# Patient Record
Sex: Male | Born: 1995 | Race: White | Hispanic: No | Marital: Single | Smoking: Current every day smoker
Health system: Southern US, Community
[De-identification: ages and names within clinical notes are randomized; demographics above are authoritative.]

## PROBLEM LIST (undated history)

## (undated) DIAGNOSIS — I10 Essential (primary) hypertension: Secondary | ICD-10-CM

## (undated) DIAGNOSIS — K219 Gastro-esophageal reflux disease without esophagitis: Secondary | ICD-10-CM

## (undated) DIAGNOSIS — F99 Mental disorder, not otherwise specified: Secondary | ICD-10-CM

## (undated) DIAGNOSIS — F419 Anxiety disorder, unspecified: Secondary | ICD-10-CM

## (undated) HISTORY — PX: ANKLE SURGERY: SHX546

## (undated) HISTORY — PX: FRACTURE SURGERY: SHX138

## (undated) HISTORY — DX: Anxiety disorder, unspecified: F41.9

## (undated) HISTORY — PX: NOSE SURGERY: SHX723

## (undated) HISTORY — DX: Gastro-esophageal reflux disease without esophagitis: K21.9

---
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
## 2013-07-12 DIAGNOSIS — E109 Type 1 diabetes mellitus without complications: Secondary | ICD-10-CM | POA: Insufficient documentation

## 2016-06-01 ENCOUNTER — Other Ambulatory Visit: Payer: Self-pay | Admitting: Endocrinology

## 2016-06-01 DIAGNOSIS — R634 Abnormal weight loss: Secondary | ICD-10-CM

## 2016-06-01 DIAGNOSIS — R630 Anorexia: Secondary | ICD-10-CM

## 2016-06-02 ENCOUNTER — Other Ambulatory Visit: Payer: Self-pay

## 2016-06-08 ENCOUNTER — Ambulatory Visit
Admission: RE | Admit: 2016-06-08 | Discharge: 2016-06-08 | Disposition: A | Discharge status: Home / Self Care | Payer: BLUE CROSS/BLUE SHIELD | Source: Ambulatory Visit | Attending: Endocrinology | Admitting: Endocrinology

## 2016-06-08 DIAGNOSIS — R634 Abnormal weight loss: Secondary | ICD-10-CM

## 2016-06-08 DIAGNOSIS — R630 Anorexia: Secondary | ICD-10-CM

## 2016-06-08 MED ORDER — IOPAMIDOL (ISOVUE-300) INJECTION 61%
100.0000 mL | Freq: Once | INTRAVENOUS | Status: AC | PRN
  Administered 2016-06-08: 100 mL via INTRAVENOUS

## 2016-07-02 ENCOUNTER — Inpatient Hospital Stay (HOSPITAL_COMMUNITY)
Admission: EM | Admit: 2016-07-02 | Discharge: 2016-07-03 | DRG: 638 | Discharge status: Against Medical Advice | Payer: BLUE CROSS/BLUE SHIELD | Attending: Internal Medicine | Admitting: Internal Medicine

## 2016-07-02 ENCOUNTER — Emergency Department (HOSPITAL_COMMUNITY): Payer: BLUE CROSS/BLUE SHIELD

## 2016-07-02 ENCOUNTER — Encounter (HOSPITAL_COMMUNITY): Payer: Self-pay | Admitting: Emergency Medicine

## 2016-07-02 DIAGNOSIS — F1210 Cannabis abuse, uncomplicated: Secondary | ICD-10-CM | POA: Diagnosis present

## 2016-07-02 DIAGNOSIS — E860 Dehydration: Secondary | ICD-10-CM | POA: Diagnosis present

## 2016-07-02 DIAGNOSIS — J069 Acute upper respiratory infection, unspecified: Secondary | ICD-10-CM | POA: Diagnosis present

## 2016-07-02 DIAGNOSIS — Z794 Long term (current) use of insulin: Secondary | ICD-10-CM | POA: Diagnosis not present

## 2016-07-02 DIAGNOSIS — E8729 Other acidosis: Secondary | ICD-10-CM | POA: Diagnosis present

## 2016-07-02 DIAGNOSIS — E1010 Type 1 diabetes mellitus with ketoacidosis without coma: Principal | ICD-10-CM | POA: Diagnosis present

## 2016-07-02 DIAGNOSIS — E872 Acidosis: Secondary | ICD-10-CM | POA: Diagnosis present

## 2016-07-02 DIAGNOSIS — Z720 Tobacco use: Secondary | ICD-10-CM

## 2016-07-02 DIAGNOSIS — Z87898 Personal history of other specified conditions: Secondary | ICD-10-CM

## 2016-07-02 DIAGNOSIS — D72829 Elevated white blood cell count, unspecified: Secondary | ICD-10-CM | POA: Diagnosis present

## 2016-07-02 DIAGNOSIS — I10 Essential (primary) hypertension: Secondary | ICD-10-CM | POA: Diagnosis present

## 2016-07-02 DIAGNOSIS — E875 Hyperkalemia: Secondary | ICD-10-CM | POA: Diagnosis present

## 2016-07-02 DIAGNOSIS — R112 Nausea with vomiting, unspecified: Secondary | ICD-10-CM | POA: Diagnosis not present

## 2016-07-02 DIAGNOSIS — E1110 Type 2 diabetes mellitus with ketoacidosis without coma: Secondary | ICD-10-CM | POA: Diagnosis present

## 2016-07-02 DIAGNOSIS — N179 Acute kidney failure, unspecified: Secondary | ICD-10-CM | POA: Diagnosis present

## 2016-07-02 DIAGNOSIS — Z8249 Family history of ischemic heart disease and other diseases of the circulatory system: Secondary | ICD-10-CM

## 2016-07-02 DIAGNOSIS — F1291 Cannabis use, unspecified, in remission: Secondary | ICD-10-CM

## 2016-07-02 DIAGNOSIS — F17210 Nicotine dependence, cigarettes, uncomplicated: Secondary | ICD-10-CM | POA: Diagnosis present

## 2016-07-02 HISTORY — DX: Essential (primary) hypertension: I10

## 2016-07-02 LAB — CBC WITH DIFFERENTIAL/PLATELET
BASOS ABS: 0 10*3/uL (ref 0.0–0.1)
BASOS PCT: 0 %
EOS ABS: 0 10*3/uL (ref 0.0–0.7)
Eosinophils Relative: 0 %
HCT: 45.9 % (ref 39.0–52.0)
Hemoglobin: 16.6 g/dL (ref 13.0–17.0)
LYMPHS ABS: 1.4 10*3/uL (ref 0.7–4.0)
Lymphocytes Relative: 5 %
MCH: 30.4 pg (ref 26.0–34.0)
MCHC: 36.2 g/dL — AB (ref 30.0–36.0)
MCV: 84.1 fL (ref 78.0–100.0)
MONO ABS: 1.4 10*3/uL — AB (ref 0.1–1.0)
Monocytes Relative: 5 %
Neutro Abs: 24.6 10*3/uL — ABNORMAL HIGH (ref 1.7–7.7)
Neutrophils Relative %: 90 %
Platelets: 396 10*3/uL (ref 150–400)
RBC: 5.46 MIL/uL (ref 4.22–5.81)
RDW: 12.5 % (ref 11.5–15.5)
WBC: 27.4 10*3/uL — ABNORMAL HIGH (ref 4.0–10.5)

## 2016-07-02 LAB — BLOOD GAS, VENOUS
ACID-BASE DEFICIT: 16.9 mmol/L — AB (ref 0.0–2.0)
BICARBONATE: 10.3 mmol/L — AB (ref 20.0–28.0)
Drawn by: 295031
FIO2: 21
O2 Saturation: 87.3 %
PATIENT TEMPERATURE: 98.6
PO2 VEN: 60.7 mmHg — AB (ref 32.0–45.0)
pCO2, Ven: 27.9 mmHg — ABNORMAL LOW (ref 44.0–60.0)
pH, Ven: 7.193 — CL (ref 7.250–7.430)

## 2016-07-02 LAB — URINALYSIS, ROUTINE W REFLEX MICROSCOPIC
Bacteria, UA: NONE SEEN
Bilirubin Urine: NEGATIVE
HGB URINE DIPSTICK: NEGATIVE
Ketones, ur: 80 mg/dL — AB
Leukocytes, UA: NEGATIVE
Nitrite: NEGATIVE
PROTEIN: NEGATIVE mg/dL
SPECIFIC GRAVITY, URINE: 1.023 (ref 1.005–1.030)
SQUAMOUS EPITHELIAL / LPF: NONE SEEN
WBC UA: NONE SEEN WBC/hpf (ref 0–5)
pH: 5 (ref 5.0–8.0)

## 2016-07-02 LAB — COMPREHENSIVE METABOLIC PANEL
ALBUMIN: 4.8 g/dL (ref 3.5–5.0)
ALT: 20 U/L (ref 17–63)
AST: 33 U/L (ref 15–41)
Alkaline Phosphatase: 112 U/L (ref 38–126)
Anion gap: 28 — ABNORMAL HIGH (ref 5–15)
BUN: 21 mg/dL — AB (ref 6–20)
CHLORIDE: 95 mmol/L — AB (ref 101–111)
CO2: 9 mmol/L — ABNORMAL LOW (ref 22–32)
CREATININE: 1.26 mg/dL — AB (ref 0.61–1.24)
Calcium: 9.1 mg/dL (ref 8.9–10.3)
GFR calc Af Amer: >60 mL/min (ref >60)
GFR calc non Af Amer: >60 mL/min (ref >60)
GLUCOSE: 596 mg/dL — AB (ref 65–99)
POTASSIUM: 5.8 mmol/L — AB (ref 3.5–5.1)
Sodium: 132 mmol/L — ABNORMAL LOW (ref 135–145)
Total Bilirubin: 2.4 mg/dL — ABNORMAL HIGH (ref 0.3–1.2)
Total Protein: 7.6 g/dL (ref 6.5–8.1)

## 2016-07-02 LAB — BASIC METABOLIC PANEL
ANION GAP: 14 (ref 5–15)
BUN: 15 mg/dL (ref 6–20)
CO2: 15 mmol/L — AB (ref 22–32)
Calcium: 7.9 mg/dL — ABNORMAL LOW (ref 8.9–10.3)
Chloride: 106 mmol/L (ref 101–111)
Creatinine, Ser: 0.98 mg/dL (ref 0.61–1.24)
GFR calc Af Amer: >60 mL/min (ref >60)
GLUCOSE: 202 mg/dL — AB (ref 65–99)
POTASSIUM: 4.2 mmol/L (ref 3.5–5.1)
Sodium: 135 mmol/L (ref 135–145)

## 2016-07-02 LAB — I-STAT CHEM 8, ED
BUN: 26 mg/dL — AB (ref 6–20)
CALCIUM ION: 1.08 mmol/L — AB (ref 1.15–1.40)
CREATININE: 0.8 mg/dL (ref 0.61–1.24)
Chloride: 101 mmol/L (ref 101–111)
GLUCOSE: 624 mg/dL — AB (ref 65–99)
HCT: 49 % (ref 39.0–52.0)
HEMOGLOBIN: 16.7 g/dL (ref 13.0–17.0)
Potassium: 5.8 mmol/L — ABNORMAL HIGH (ref 3.5–5.1)
Sodium: 130 mmol/L — ABNORMAL LOW (ref 135–145)
TCO2: 14 mmol/L (ref 0–100)

## 2016-07-02 LAB — CBG MONITORING, ED
GLUCOSE-CAPILLARY: 550 mg/dL — AB (ref 65–99)
GLUCOSE-CAPILLARY: 329 mg/dL — AB (ref 65–99)
Glucose-Capillary: 576 mg/dL (ref 65–99)
Glucose-Capillary: 369 mg/dL — ABNORMAL HIGH (ref 65–99)
Glucose-Capillary: 203 mg/dL — ABNORMAL HIGH (ref 65–99)

## 2016-07-02 LAB — LIPASE, BLOOD: LIPASE: 30 U/L (ref 11–51)

## 2016-07-02 MED ORDER — SODIUM CHLORIDE 0.9 % IV BOLUS (SEPSIS)
1000.0000 mL | Freq: Once | INTRAVENOUS | Status: AC
  Administered 2016-07-02: 1000 mL via INTRAVENOUS

## 2016-07-02 MED ORDER — SODIUM CHLORIDE 0.9 % IV SOLN
INTRAVENOUS | Status: DC
  Administered 2016-07-02: 2.4 [IU]/h via INTRAVENOUS
  Filled 2016-07-02: qty 2.5

## 2016-07-02 MED ORDER — DEXTROSE-NACL 5-0.45 % IV SOLN
INTRAVENOUS | Status: DC
  Administered 2016-07-02 – 2016-07-03 (×3): via INTRAVENOUS

## 2016-07-02 MED ORDER — SODIUM CHLORIDE 0.9 % IV SOLN
INTRAVENOUS | Status: DC
  Administered 2016-07-02: 5.2 [IU]/h via INTRAVENOUS
  Filled 2016-07-02: qty 2.5

## 2016-07-02 MED ORDER — LISINOPRIL 10 MG PO TABS
10.0000 mg | ORAL_TABLET | Freq: Every day | ORAL | Status: DC
  Administered 2016-07-02: 10 mg via ORAL
  Filled 2016-07-02: qty 1

## 2016-07-02 MED ORDER — ENOXAPARIN SODIUM 40 MG/0.4ML ~~LOC~~ SOLN: 40.0000 mg | SUBCUTANEOUS | Status: DC

## 2016-07-02 MED ORDER — INSULIN REGULAR BOLUS VIA INFUSION
0.0000 [IU] | Freq: Three times a day (TID) | INTRAVENOUS | Status: DC
  Filled 2016-07-02: qty 10

## 2016-07-02 MED ORDER — DEXTROSE-NACL 5-0.45 % IV SOLN
INTRAVENOUS | Status: DC
  Administered 2016-07-02: 20:00:00 via INTRAVENOUS

## 2016-07-02 MED ORDER — SODIUM CHLORIDE 0.9 % IV SOLN
INTRAVENOUS | Status: AC
  Administered 2016-07-02: 23:00:00 via INTRAVENOUS

## 2016-07-02 MED ORDER — DEXTROSE 50 % IV SOLN: 25.0000 mL | INTRAVENOUS | Status: DC | PRN

## 2016-07-02 MED ORDER — DM-GUAIFENESIN ER 30-600 MG PO TB12: 1.0000 | ORAL_TABLET | Freq: Two times a day (BID) | ORAL | Status: DC | PRN

## 2016-07-02 MED ORDER — FAMOTIDINE 20 MG PO TABS
20.0000 mg | ORAL_TABLET | Freq: Every day | ORAL | Status: DC
Start: 2016-07-02 — End: 2016-07-03
  Administered 2016-07-02 – 2016-07-03 (×2): 20 mg via ORAL
  Filled 2016-07-02 (×2): qty 1

## 2016-07-02 MED ORDER — SODIUM CHLORIDE 0.9 % IV SOLN: INTRAVENOUS | Status: DC

## 2016-07-02 MED ORDER — SODIUM CHLORIDE 0.9 % IV SOLN
INTRAVENOUS | Status: DC
  Administered 2016-07-02: 19:00:00 via INTRAVENOUS

## 2016-07-02 MED ORDER — ONDANSETRON HCL 4 MG/2ML IJ SOLN
4.0000 mg | Freq: Once | INTRAMUSCULAR | Status: AC
  Administered 2016-07-02: 4 mg via INTRAVENOUS
  Filled 2016-07-02: qty 2

## 2016-07-02 NOTE — ED Notes (Signed)
Informed MD of Chem 8 results.

## 2016-07-02 NOTE — ED Provider Notes (Signed)
[facility]-EMERGENCY DEPT Provider Note   CSN: [id number] Arrival date & time: 07/02/16  1503     History   Chief Complaint Chief Complaint  Patient presents with  . Hyperglycemia  . Nausea  . Emesis  . Generalized Body Aches    HPI [name] is a 21 y.o. male who presents with  Hyperglycemia,  N/V. The patient has a pmh of DM type 1 siwith hx of DKA. He is a daily smoker of both tobacco and marijuana. Patient states that several months ago. He was switched to a new type of insulin and since that time, his sugars have been running about 500 and his hemoglobin A1c has been running about 10. Patient states that he's also had about a 20 pound weight loss without trying. He has recently had about 2 weeks of URI symptoms with cough productive of phlegm and body aches along with nasal congestion. He did see his PCP and had a flu swab that was negative. This morning the patient awoke around 9 AM and became nauseous. He started vomiting and has had constant vomiting about every 15 minutes since that time. When EMS arrived, he was found to have blood sugars in the 400s. The patient complains of nausea, dry mouth, and body aches. Patient's last dose of insulin. The60 units last night.   HPI  Past Medical History:  Diagnosis Date  . Diabetes mellitus   . Mental disorder     Patient Active Problem List   Diagnosis Date Noted  . DKA (diabetic ketoacidoses) (HCC) 07/02/2016  . Tachycardia 11/14/2011  . Volume depletion 11/14/2011  . 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 DKA, type 1 (HCC) 11/13/2011  . Psychosis 11/13/2011  . High anion gap metabolic acidosis 11/13/2011  . Leucocytosis 11/13/2011    Past Surgical History:  Procedure Laterality Date  . ANKLE SURGERY    . NOSE SURGERY         Home Medications    Prior to Admission medications   Medication Sig Start Date End Date Taking? Authorizing Provider  ibuprofen (ADVIL,MOTRIN) 200 MG  tablet Take 200 mg by mouth every 6 (six) hours as needed.   Yes Historical Provider, MD  insulin aspart (NOVOLOG) 100 UNIT/ML injection Inject 10 Units into the skin daily with lunch.    Yes Historical Provider, MD  lisinopril (PRINIVIL,ZESTRIL) 10 MG tablet Take 10 mg by mouth at bedtime.   Yes Historical Provider, MD  TRESIBA FLEXTOUCH 200 UNIT/ML SOPN Inject 36 Units into the skin daily. 06/15/16  Yes Historical Provider, MD    Family History History reviewed. No pertinent family history.  Social History Social History  Substance Use Topics  . Smoking status: Current Some Day Smoker    Packs/day: 0.50  . Smokeless tobacco: Not on file  . Alcohol use No     Allergies   Patient has no known allergies.   Review of Systems Review of Systems  Ten systems reviewed and are negative for acute change, except as noted in the HPI.   Physical Exam Updated Vital Signs BP (!) 114/48 (BP Location: Right Arm)   Pulse 114   Temp 98.1 F (36.7 C) (Oral)   Resp 16   Ht 5\' 6"  (1.676 m)   Wt 56.7 kg   SpO2 99%   BMI 20.18 kg/m   Physical Exam  Constitutional: He appears well-developed and well-nourished. No distress.  Thin male in no acute distress  HENT:  Head: Normocephalic and atraumatic.  Dry oral mucosa   Eyes: Conjunctivae and EOM are normal. Pupils are equal, round, and reactive to light. No scleral icterus.  Neck: Normal range of motion. Neck supple.  Cardiovascular: Normal rate, regular rhythm and normal heart sounds.   Pulmonary/Chest: Effort normal. No respiratory distress. He has rhonchi in the right lower field and the left lower field.  Abdominal: Soft. There is no tenderness.  Musculoskeletal: He exhibits no edema.  Neurological: He is alert.  Skin: Skin is warm and dry. He is not diaphoretic.  Psychiatric: His behavior is normal.  Nursing note and vitals reviewed.    ED Treatments / Results  Labs (all labs ordered are listed, but only abnormal results are  displayed) Labs Reviewed  CBC WITH DIFFERENTIAL/PLATELET - Abnormal; Notable for the following:       Result Value   WBC 27.4 (*)    MCHC 36.2 (*)    Neutro Abs 24.6 (*)    Monocytes Absolute 1.4 (*)    All other components within normal limits  BLOOD GAS, VENOUS - Abnormal; Notable for the following:    pH, Ven 7.193 (*)    pCO2, Ven 27.9 (*)    pO2, Ven 60.7 (*)    Bicarbonate 10.3 (*)    Acid-base deficit 16.9 (*)    All other components within normal limits  URINALYSIS, ROUTINE W REFLEX MICROSCOPIC - Abnormal; Notable for the following:    Color, Urine STRAW (*)    Glucose, UA >=500 (*)    Ketones, ur 80 (*)    All other components within normal limits  COMPREHENSIVE METABOLIC PANEL - Abnormal; Notable for the following:    Sodium 132 (*)    Potassium 5.8 (*)    Chloride 95 (*)    CO2 9 (*)    Glucose, Bld 596 (*)    BUN 21 (*)    Creatinine, Ser 1.26 (*)    Total Bilirubin 2.4 (*)    Anion gap 28 (*)    All other components within normal limits  CBG MONITORING, ED - Abnormal; Notable for the following:    Glucose-Capillary 550 (*)    All other components within normal limits  I-STAT CHEM 8, ED - Abnormal; Notable for the following:    Sodium 130 (*)    Potassium 5.8 (*)    BUN 26 (*)    Glucose, Bld 624 (*)    Calcium, Ion 1.08 (*)    All other components within normal limits  CBG MONITORING, ED - Abnormal; Notable for the following:    Glucose-Capillary 576 (*)    All other components within normal limits  CBG MONITORING, ED - Abnormal; Notable for the following:    Glucose-Capillary 369 (*)    All other components within normal limits  RESPIRATORY PANEL BY PCR  LIPASE, BLOOD  RAPID URINE DRUG SCREEN, HOSP PERFORMED    EKG  EKG Interpretation None       Radiology Dg Chest Portable 1 View  Result Date: 07/02/2016 CLINICAL DATA:  Pt c/o weakness, sob, chest discomfort, n/v, onset x "awhile," worsened today. pt states his diabetes medicine changed  recently, notes fluctuating blood sugar levels. EXAM: PORTABLE CHEST 1 VIEW COMPARISON:  11/13/2011 FINDINGS: The heart size and mediastinal contours are within normal limits. Both lungs are clear. No pleural effusion or pneumothorax. The visualized skeletal structures are unremarkable. IMPRESSION: Normal frontal chest radiograph. Electronically Signed   By: Amie Portland M.D.   On: 07/02/2016  15:55    Procedures Procedures (including critical care time)  Medications Ordered in ED Medications  insulin regular (NOVOLIN R,HUMULIN R) 250 Units in sodium chloride 0.9 % 250 mL (1 Units/mL) infusion (5.2 Units/hr Intravenous New Bag/Given 07/02/16 1624)  sodium chloride 0.9 % bolus 1,000 mL (1,000 mLs Intravenous New Bag/Given 07/02/16 1624)    And  sodium chloride 0.9 % bolus 1,000 mL (not administered)    And  0.9 %  sodium chloride infusion (not administered)  ondansetron (ZOFRAN) injection 4 mg (4 mg Intravenous Given 07/02/16 1558)     Initial Impression / Assessment and Plan / ED Course  I have reviewed the triage vital signs and the nursing notes.  Pertinent labs & imaging results that were available during my care of the patient were reviewed by me and considered in my medical decision making (see chart for details).  Clinical Course as of Jul 02 1724  Wynelle LinkSun Jul 02, 2016  1615 Patient ABG showss PH of 7.193 with  [AH]  1618 Cxr clear DG Chest Portable 1 View [AH]  1619 Anion gap is 22   [AH]    Clinical Course User Index [AH] Arthor CaptainAbigail Ronesha Heenan, PA-C    Patient with DKA Will be admitted by Dr. Catha GosselinMikhail. Stable throughout visit.  Final Clinical Impressions(s) / ED Diagnoses   Final diagnoses:  Diabetic ketoacidosis without coma associated with type 1 diabetes mellitus [facility](HCC)    New Prescriptions New Prescriptions   No medications on file     Arthor Captainbigail Janith Nielson, PA-C 07/02/16 1727    Lavera Guiseana Duo Liu, MD 07/02/16 1747

## 2016-07-02 NOTE — ED Notes (Signed)
ED Provider at bedside. 

## 2016-07-02 NOTE — H&P (Signed)
[facility] History and Physical  Robert FamKody R Elizardo EXB:[id number] DOB: 05/02/1996 DOA: 07/02/2016  PCP: Julian HySOUTH,STEPHEN ALAN, MD  Patient coming from: Home  Chief Complaint: Elevated sugars  HPI: Robert Villa is a 21 y.o. male with a medical history of diabetes mellitus, type I, hypertension, who presents to the ER with elevated sugars, nausea, vomiting.  Patient states that he has been having upper respiratory like symptoms for approximate 2 weeks. He's been experiencing a productive cough along with body aches and congestion. Patient did see his primary care physician approximately one week ago and had a negative flu swab at that time. Patient states that he does smoke marijuana regularly, last use was yesterday evening. He did have nausea and vomiting which awoke him from sleep approximate 9 AM this morning. He has had constant vomiting approximately every 15 minutes before arrival to the emergency department. Also states that he has had weight loss over the last several weeks. He's also been having more frequent urination and polydipsia. He states he's had diabetes since he was 21 years old and has been relatively stable until last 6 months. He was recently switched to [place] but does not feel that his sugars have been adequately controlled. Patient does state that he used short acting insulin yesterday evening however awoke to very elevated blood sugars in the 400s today. Patient denies any recent sick contacts, travel, denies current chest pain, dizziness, headache, diarrhea, constipation.  ED Course: Found to have DKA, sugars in 500s, pH 7.96, elevated anion gap . Started on IVF and insulin drip. [facility] called for admission.  Review of Systems:  All other systems reviewed and are negative.   Past Medical History:  Diagnosis Date  . Diabetes mellitus   . Mental disorder     Past Surgical History:  Procedure Laterality Date  . ANKLE SURGERY    . NOSE SURGERY      Social History:   reports that he has been smoking.  He has been smoking about 0.50 packs per day. He does not have any smokeless tobacco history on file. He reports that he uses drugs, including Marijuana. He reports that he does not drink alcohol.  No Known Allergies  Family history Grandfather had hypertension.   Prior to Admission medications   Medication Sig Start Date End Date Taking? Authorizing Provider  insulin aspart (NOVOLOG) 100 UNIT/ML injection Inject 10 Units into the skin daily with lunch.     Historical Provider, MD  insulin NPH (HUMULIN N,NOVOLIN N) 100 UNIT/ML injection Inject 35-50 Units into the skin daily. SS   300=35 units    400=50 units    Historical Provider, MD  TRESIBA FLEXTOUCH 200 UNIT/ML SOPN Inject 36 Units into the skin daily. 06/15/16   Historical Provider, MD    Physical Exam: Vitals:   07/02/16 1556 07/02/16 1632  BP: 130/86 (!) 114/48  Pulse: 107 114  Resp: 16 16  Temp:       General: Well developed, well nourished, NAD, appears stated age  HEENT: NCAT, PERRLA, EOMI, Anicteic Sclera, mucous membranes dry.   Neck: Supple, no JVD, no masses  Cardiovascular: S1 S2 auscultated, no rubs, murmurs or gallops. Tachycardia.  Respiratory: Mild wheezing notes anteriorly, otherwise clear.   Abdomen: Soft, nontender, nondistended, + bowel sounds  Extremities: warm dry without cyanosis clubbing or edema  Neuro: AAOx3, cranial nerves grossly intact. Strength 5/5 in patient's upper and lower extremities bilaterally  Skin: Without rashes exudates or nodules  Psych: Normal affect and  demeanor with intact judgement and insight  Labs on Admission: I have personally reviewed following labs and imaging studies CBC:  Recent Labs Lab 07/02/16 1553 07/02/16 1613  WBC 27.4*  --   NEUTROABS PENDING  --   HGB 16.6 16.7  HCT 45.9 49.0  MCV 84.1  --   PLT 396  --    Basic Metabolic Panel:  Recent Labs Lab 07/02/16 1613  NA 130*  K 5.8*  CL 101  GLUCOSE 624*    BUN 26*  CREATININE 0.80   GFR: Estimated Creatinine Clearance: 116.2 mL/min (by C-G formula based on SCr of 0.8 mg/dL). Liver Function Tests: No results for input(s): AST, ALT, ALKPHOS, BILITOT, PROT, ALBUMIN in the last 168 hours. No results for input(s): LIPASE, AMYLASE in the last 168 hours. No results for input(s): AMMONIA in the last 168 hours. Coagulation Profile: No results for input(s): INR, PROTIME in the last 168 hours. Cardiac Enzymes: No results for input(s): CKTOTAL, CKMB, CKMBINDEX, TROPONINI in the last 168 hours. BNP (last 3 results) No results for input(s): PROBNP in the last 8760 hours. HbA1C: No results for input(s): HGBA1C in the last 72 hours. CBG:  Recent Labs Lab 07/02/16 1513 07/02/16 1620  GLUCAP 550* 576*   Lipid Profile: No results for input(s): CHOL, HDL, LDLCALC, TRIG, CHOLHDL, LDLDIRECT in the last 72 hours. Thyroid Function Tests: No results for input(s): TSH, T4TOTAL, FREET4, T3FREE, THYROIDAB in the last 72 hours. Anemia Panel: No results for input(s): VITAMINB12, FOLATE, FERRITIN, TIBC, IRON, RETICCTPCT in the last 72 hours. Urine analysis:    Component Value Date/Time   COLORURINE STRAW (A) 07/02/2016 1530   APPEARANCEUR CLEAR 07/02/2016 1530   LABSPEC 1.023 07/02/2016 1530   PHURINE 5.0 07/02/2016 1530   GLUCOSEU >=500 (A) 07/02/2016 1530   HGBUR NEGATIVE 07/02/2016 1530   BILIRUBINUR NEGATIVE 07/02/2016 1530   KETONESUR 80 (A) 07/02/2016 1530   PROTEINUR NEGATIVE 07/02/2016 1530   UROBILINOGEN 0.2 11/13/2011 1303   NITRITE NEGATIVE 07/02/2016 1530   LEUKOCYTESUR NEGATIVE 07/02/2016 1530   Sepsis Labs: @LABRCNTIP (procalcitonin:4,lacticidven:4) )No results found for this or any previous visit (from the past 240 hour(s)).   Radiological Exams on Admission: Dg Chest Portable 1 View  Result Date: 07/02/2016 CLINICAL DATA:  Pt c/o weakness, sob, chest discomfort, n/v, onset x "awhile," worsened today. pt states his diabetes  medicine changed recently, notes fluctuating blood sugar levels. EXAM: PORTABLE CHEST 1 VIEW COMPARISON:  11/13/2011 FINDINGS: The heart size and mediastinal contours are within normal limits. Both lungs are clear. No pleural effusion or pneumothorax. The visualized skeletal structures are unremarkable. IMPRESSION: Normal frontal chest radiograph. Electronically Signed   By: Amie Portland M.D.   On: 07/02/2016 15:55    EKG: None  Assessment/Plan  DKA/Uncontrolled diabetes mellitus type I -Blood glucose 624 on admission, anion gap 28 -pH 7.193, bicarb 9 -Utilized DKA order set, monitor BMP q4hrs and CBG monitoring  -Admit to stepdown, placed on glucose stabilizer -Continue IV fluids -Nothing by mouth -UA and chest x-ray unremarkable for infection -Of note, patient states is his hemoglobin A1c has been very elevated lately around 10 since starting Tresiba. He does follow with Dr. Evlyn Kanner  Leukocytosis -UA/CXR unremarkable for infection -WBC 27.4 -Likely reactive -Continue to monitor CBC  Possible upper respiratory infection/Cough -have asked for respiratory viral panel  -mucinex for cough  Pseudohyponatremia -Secondary to DKA -Continue IVF, monitor BMP   Hyperkalemia -Suspect potassium will improve once insulin drip has started. Will check BMP every 4 hours  Nausea/Emesis -Continue antiemetics PRN  Acute kidney injury -Creatinine 1.26 upon admission -Likely secondary to DKA and dehydration -Suspect will improve with IV fluids -Continue to monitor BMP  Essential hypertension -Continue lisinopril  Marijuana abuse -Drug screen pending -Last use yesterday -Counseled on drug use cessation  Tobacco abuse -Smokes 3-4 cigarettes per day -Counseled on smoking cessation -Declined nicotine patch  DVT prophylaxis: Lovenox  Code Status: Full  Family Communication: Family at bedside. Admission, patients condition and plan of care including tests being ordered have been  discussed with the patient and family who indicate understanding and agree with the plan and Code Status.  Disposition Plan: Admitted to stepdown. Home when stable/DKA resolves   Consults called: None   Admission status: Inpatient   Time spent: 65 minutes  Hao Dion D.O. [facility] Pager [phone]  If 7PM-7AM, please contact night-coverage [email or web address] Password [facility] 07/02/2016, 4:46 PM

## 2016-07-02 NOTE — ED Triage Notes (Addendum)
Per EMS, patient from home, c/o hyperglycemia, N/V, and body aches since this morning. Patient also reports he has not had his insulin since last night. Reports he took 60 units of insulin at that time. Ambulatory with EMS.

## 2016-07-02 NOTE — ED Notes (Signed)
Bed: EA54WA22 Expected date:  Expected time:  Means of arrival:  Comments: 21 yo N/V-non-compliant DM1-cbg 443

## 2016-07-03 ENCOUNTER — Encounter (HOSPITAL_COMMUNITY): Payer: Self-pay

## 2016-07-03 LAB — GLUCOSE, CAPILLARY
GLUCOSE-CAPILLARY: 132 mg/dL — AB (ref 65–99)
GLUCOSE-CAPILLARY: 131 mg/dL — AB (ref 65–99)
GLUCOSE-CAPILLARY: 121 mg/dL — AB (ref 65–99)
GLUCOSE-CAPILLARY: 127 mg/dL — AB (ref 65–99)
GLUCOSE-CAPILLARY: 132 mg/dL — AB (ref 65–99)
GLUCOSE-CAPILLARY: 109 mg/dL — AB (ref 65–99)
GLUCOSE-CAPILLARY: 117 mg/dL — AB (ref 65–99)
GLUCOSE-CAPILLARY: 123 mg/dL — AB (ref 65–99)
Glucose-Capillary: 110 mg/dL — ABNORMAL HIGH (ref 65–99)
Glucose-Capillary: 116 mg/dL — ABNORMAL HIGH (ref 65–99)
Glucose-Capillary: 141 mg/dL — ABNORMAL HIGH (ref 65–99)
Glucose-Capillary: 149 mg/dL — ABNORMAL HIGH (ref 65–99)
Glucose-Capillary: 157 mg/dL — ABNORMAL HIGH (ref 65–99)
Glucose-Capillary: 161 mg/dL — ABNORMAL HIGH (ref 65–99)
Glucose-Capillary: 163 mg/dL — ABNORMAL HIGH (ref 65–99)
Glucose-Capillary: 179 mg/dL — ABNORMAL HIGH (ref 65–99)
Glucose-Capillary: 179 mg/dL — ABNORMAL HIGH (ref 65–99)
Glucose-Capillary: 180 mg/dL — ABNORMAL HIGH (ref 65–99)
Glucose-Capillary: 209 mg/dL — ABNORMAL HIGH (ref 65–99)

## 2016-07-03 LAB — RESPIRATORY PANEL BY PCR
ADENOVIRUS-RVPPCR: NOT DETECTED
BORDETELLA PERTUSSIS-RVPCR: NOT DETECTED
CHLAMYDOPHILA PNEUMONIAE-RVPPCR: NOT DETECTED
Coronavirus 229E: NOT DETECTED
Coronavirus HKU1: NOT DETECTED
Coronavirus NL63: NOT DETECTED
Coronavirus OC43: NOT DETECTED
INFLUENZA A-RVPPCR: NOT DETECTED
INFLUENZA B-RVPPCR: NOT DETECTED
Metapneumovirus: NOT DETECTED
Mycoplasma pneumoniae: NOT DETECTED
PARAINFLUENZA VIRUS 4-RVPPCR: NOT DETECTED
Parainfluenza Virus 1: NOT DETECTED
Parainfluenza Virus 2: NOT DETECTED
Parainfluenza Virus 3: NOT DETECTED
RESPIRATORY SYNCYTIAL VIRUS-RVPPCR: NOT DETECTED
RHINOVIRUS / ENTEROVIRUS - RVPPCR: NOT DETECTED

## 2016-07-03 LAB — RAPID URINE DRUG SCREEN, HOSP PERFORMED
AMPHETAMINES: NOT DETECTED
BENZODIAZEPINES: NOT DETECTED
Barbiturates: NOT DETECTED
COCAINE: NOT DETECTED
OPIATES: NOT DETECTED
Tetrahydrocannabinol: POSITIVE — AB

## 2016-07-03 LAB — BASIC METABOLIC PANEL
ANION GAP: 15 (ref 5–15)
ANION GAP: 13 (ref 5–15)
Anion gap: 11 (ref 5–15)
Anion gap: 13 (ref 5–15)
BUN: 11 mg/dL (ref 6–20)
BUN: 7 mg/dL (ref 6–20)
BUN: 5 mg/dL — AB (ref 6–20)
CHLORIDE: 107 mmol/L (ref 101–111)
CHLORIDE: 106 mmol/L (ref 101–111)
CHLORIDE: 106 mmol/L (ref 101–111)
CO2: 13 mmol/L — ABNORMAL LOW (ref 22–32)
CO2: 17 mmol/L — AB (ref 22–32)
CO2: 17 mmol/L — AB (ref 22–32)
CO2: 13 mmol/L — AB (ref 22–32)
CREATININE: 0.67 mg/dL (ref 0.61–1.24)
CREATININE: 0.64 mg/dL (ref 0.61–1.24)
Calcium: 7.7 mg/dL — ABNORMAL LOW (ref 8.9–10.3)
Calcium: 8.2 mg/dL — ABNORMAL LOW (ref 8.9–10.3)
Calcium: 8.1 mg/dL — ABNORMAL LOW (ref 8.9–10.3)
Calcium: 8 mg/dL — ABNORMAL LOW (ref 8.9–10.3)
Chloride: 109 mmol/L (ref 101–111)
Creatinine, Ser: 0.82 mg/dL (ref 0.61–1.24)
Creatinine, Ser: 0.74 mg/dL (ref 0.61–1.24)
GFR calc Af Amer: >60 mL/min (ref >60)
GFR calc Af Amer: >60 mL/min (ref >60)
GFR calc Af Amer: >60 mL/min (ref >60)
GFR calc non Af Amer: >60 mL/min (ref >60)
GFR calc non Af Amer: >60 mL/min (ref >60)
GLUCOSE: 159 mg/dL — AB (ref 65–99)
Glucose, Bld: 151 mg/dL — ABNORMAL HIGH (ref 65–99)
Glucose, Bld: 169 mg/dL — ABNORMAL HIGH (ref 65–99)
Glucose, Bld: 122 mg/dL — ABNORMAL HIGH (ref 65–99)
POTASSIUM: 4 mmol/L (ref 3.5–5.1)
Potassium: 3.8 mmol/L (ref 3.5–5.1)
Potassium: 3.6 mmol/L (ref 3.5–5.1)
Potassium: 3.1 mmol/L — ABNORMAL LOW (ref 3.5–5.1)
SODIUM: 135 mmol/L (ref 135–145)
Sodium: 135 mmol/L (ref 135–145)
Sodium: 134 mmol/L — ABNORMAL LOW (ref 135–145)
Sodium: 136 mmol/L (ref 135–145)

## 2016-07-03 LAB — CBC
HCT: 38.3 % — ABNORMAL LOW (ref 39.0–52.0)
Hemoglobin: 13.3 g/dL (ref 13.0–17.0)
MCH: 29.4 pg (ref 26.0–34.0)
MCHC: 34.7 g/dL (ref 30.0–36.0)
MCV: 84.7 fL (ref 78.0–100.0)
PLATELETS: 282 10*3/uL (ref 150–400)
RBC: 4.52 MIL/uL (ref 4.22–5.81)
RDW: 12.8 % (ref 11.5–15.5)
WBC: 21.6 10*3/uL — ABNORMAL HIGH (ref 4.0–10.5)

## 2016-07-03 LAB — MRSA PCR SCREENING: MRSA by PCR: NEGATIVE

## 2016-07-03 MED ORDER — SODIUM CHLORIDE 0.9 % IV SOLN: INTRAVENOUS | Status: DC

## 2016-07-03 MED ORDER — INSULIN ASPART 100 UNIT/ML ~~LOC~~ SOLN
1.0000 [IU] | Freq: Once | SUBCUTANEOUS | Status: AC
  Administered 2016-07-03: 1 [IU] via SUBCUTANEOUS

## 2016-07-03 MED ORDER — INSULIN ASPART 100 UNIT/ML ~~LOC~~ SOLN: 0.0000 [IU] | Freq: Every day | SUBCUTANEOUS | Status: DC

## 2016-07-03 MED ORDER — POTASSIUM CHLORIDE CRYS ER 20 MEQ PO TBCR
40.0000 meq | EXTENDED_RELEASE_TABLET | Freq: Once | ORAL | Status: AC
Start: 2016-07-03 — End: 2016-07-03
  Administered 2016-07-03: 40 meq via ORAL
  Filled 2016-07-03: qty 2

## 2016-07-03 MED ORDER — DEXTROSE-NACL 5-0.45 % IV SOLN: INTRAVENOUS | Status: DC

## 2016-07-03 MED ORDER — NICOTINE 7 MG/24HR TD PT24
7.0000 mg | MEDICATED_PATCH | Freq: Every day | TRANSDERMAL | Status: DC
  Administered 2016-07-03: 7 mg via TRANSDERMAL
  Filled 2016-07-03: qty 1

## 2016-07-03 MED ORDER — INSULIN GLARGINE 100 UNIT/ML ~~LOC~~ SOLN
16.0000 [IU] | Freq: Every day | SUBCUTANEOUS | Status: DC
  Administered 2016-07-03: 16 [IU] via SUBCUTANEOUS
  Filled 2016-07-03 (×2): qty 0.16

## 2016-07-03 MED ORDER — SODIUM BICARBONATE 8.4 % IV SOLN
INTRAVENOUS | Status: DC
  Filled 2016-07-03: qty 1000

## 2016-07-03 MED ORDER — ACETAMINOPHEN 325 MG PO TABS
650.0000 mg | ORAL_TABLET | Freq: Four times a day (QID) | ORAL | Status: DC | PRN
  Administered 2016-07-03: 650 mg via ORAL
  Filled 2016-07-03: qty 2

## 2016-07-03 MED ORDER — INSULIN ASPART 100 UNIT/ML ~~LOC~~ SOLN: 0.0000 [IU] | Freq: Three times a day (TID) | SUBCUTANEOUS | Status: DC

## 2016-07-03 NOTE — Progress Notes (Signed)
Inpatient Diabetes Program Recommendations  AACE/ADA: New Consensus Statement on Inpatient Glycemic Control (2015)  Target Ranges:  Prepandial:   less than 140 mg/dL      Peak postprandial:   less than 180 mg/dL (1-2 hours)      Critically ill patients:  140 - 180 mg/dL   Results for Robert Villa, Robert Villa (MRN [id number]) as of 07/03/2016 09:56  Ref. Range 07/02/2016 15:53  Sodium Latest Ref Range: 135 - 145 mmol/L 132 (L)  Potassium Latest Ref Range: 3.5 - 5.1 mmol/L 5.8 (H)  Chloride Latest Ref Range: 101 - 111 mmol/L 95 (L)  CO2 Latest Ref Range: 22 - 32 mmol/L 9 (L)  Glucose Latest Ref Range: 65 - 99 mg/dL 409596 (HH)  BUN Latest Ref Range: 6 - 20 mg/dL 21 (H)  Creatinine Latest Ref Range: 0.61 - 1.24 mg/dL 8.111.26 (H)  Calcium Latest Ref Range: 8.9 - 10.3 mg/dL 9.1  Anion gap Latest Ref Range: 5 - 15  28 (H)    Admit with: DKA  History: Type 1 DM  Home DM Meds: Tresiba 36 units daily       Novolog  Current Insulin Orders: IV Insulin drip     -Patient sees Dr. Evlyn [place]annerSouth for DM management.  -Given 16 units Lantus at 3am, however, patient not ready to transition off IV Insulin drip yet.  Anion Gap still elevated to 15 and CO2 level only up to 13 this morning on BMET.  -IV Insulin drip restarted at 8am per MD orders.  -Will attempt to see pt today to try to determine cause of DKA and review sick day rules with patient.     --Will follow patient during hospitalization--  Ambrose FinlandJeannine Johnston Marylin Lathon RN, MSN, CDE Diabetes Coordinator Inpatient Glycemic Control Team Team Pager: [phone] (8a-5p)

## 2016-07-03 NOTE — Progress Notes (Signed)
Per previous RN drip has been stopped since 0525 this morning. Per [facility] restart patient on insulin drip, make patient NPO, and leave patient on current fluid regimen. Will continue to follow up. Insulin drip has been restarted.

## 2016-07-03 NOTE — Progress Notes (Signed)
The patient is requesting to leave immediately and against medical advice. He is aware that per his MD he needs to remain in the hospital for further treatment for DKA however he states he is ready to leave now. MD has been made aware and the patient is willing to sign AMA  Paperwork. Will continue to follow up. Currently the patient is alert and oriented and his vital signs are stable.

## 2016-07-03 NOTE — Progress Notes (Signed)
PROGRESS NOTE    Robert Villa  RUE:[id number] DOB: [birth year] DOA: 07/02/2016 PCP: Julian Hy, MD   Chief Complaint  Patient presents with  . Hyperglycemia  . Nausea  . Emesis  . Generalized Body Aches    Brief Narrative:  HPI on 07/05/2016  Robert Villa is a 21 y.o. male with a medical history of diabetes mellitus, type I, hypertension, who presents to the ER with elevated sugars, nausea, vomiting.  Patient states that he has been having upper respiratory like symptoms for approximate 2 weeks. He's been experiencing a productive cough along with body aches and congestion. Patient did see his primary care physician approximately one week ago and had a negative flu swab at that time. Patient states that he does smoke marijuana regularly, last use was yesterday evening. He did have nausea and vomiting which awoke him from sleep approximate 9 AM this morning. He has had constant vomiting approximately every 15 minutes before arrival to the emergency department. Also states that he has had weight loss over the last several weeks. He's also been having more frequent urination and polydipsia. He states he's had diabetes since he was 21 years old and has been relatively stable until last 6 months. He was recently switched to [place] but does not feel that his sugars have been adequately controlled. Patient does state that he used short acting insulin yesterday evening however awoke to very elevated blood sugars in the 400s today. Patient denies any recent sick contacts, travel, denies current chest pain, dizziness, headache, diarrhea, constipation. Assessment & Plan   DKA/Uncontrolled diabetes mellitus type I -Upon admission Blood glucose 624,  anion gap 28, pH 7.193, bicarb 9 -Utilized DKA order set, monitor BMP q4hrs and CBG monitoring  -Continue glucose stabilizer -Continue IV fluids -Nothing by mouth (not sure why he was eating breakfast this morning.  -UA and chest x-ray unremarkable for  infection -Of note, patient states is his hemoglobin A1c has been very elevated lately around 10 since starting Tresiba. He does follow with Dr. Evlyn Kanner -Not sure why insulin drip was discontinued overnight, anion gap closed, however bicarb still 13  Leukocytosis -UA/CXR unremarkable for infection -WBC 27.4 -Likely reactive -Continue to monitor CBC  Possible upper respiratory infection/Cough -Respiratory viral panel pending -mucinex for cough  Pseudohyponatremia -Secondary to DKA, Na improving -Continue IVF, monitor BMP   Hyperkalemia -Resolved.   Nausea/Emesis -Continue antiemetics PRN  Acute kidney injury -Resolved -Creatinine 1.26 upon admission. Creatinine currently 0.74 -Likely secondary to DKA and dehydration -Suspect will improve with IV fluids -Continue to monitor BMP  Essential hypertension -Continue lisinopril  Marijuana abuse -Drug screen pending -Last use yesterday -Counseled on drug use cessation  Tobacco abuse -Smokes 3-4 cigarettes per day -Counseled on smoking cessation -Now wants nicotine patch  DVT Prophylaxis  Lovenox  Code Status: Full  Family Communication: Fiance at bedside  Disposition Plan: Admitted to stepdown. Home when DKA resolves  Consultants None  Procedures  none  Antibiotics   Anti-infectives    None      Subjective:   Robert Villa seen and examined today.  Feeling mildly better. States he could no eat breakfast, felt nauseous.  Patient denies dizziness, chest pain, shortness of breath, abdominal pain, N/V/D/C.  Wants to go home today.  Objective:   Vitals:   07/03/16 0700 07/03/16 0800 07/03/16 0900 07/03/16 1000  BP: (!) 120/56 119/83 113/72 119/67  Pulse:      Resp: 18 13 18  (!) 23  Temp:  98.2  F (36.8 C)    TempSrc:  Oral    SpO2: 98% 98% 97% 98%  Weight:      Height:        Intake/Output Summary (Last 24 hours) at 07/03/16 1044 Last data filed at 07/03/16 0800  Gross per 24 hour  Intake           3857.56 ml  Output              730 ml  Net          3127.56 ml   Filed Weights   07/02/16 1514 07/02/16 2100  Weight: 56.7 kg (125 lb) 56.7 kg (125 lb)    Exam  General: Well developed, well nourished, NAD, appears stated age  HEENT: NCAT,mucous membranes moist.   Cardiovascular: S1 S2 auscultated, no rubs, murmurs or gallops. Tachycardic  Respiratory: Clear to auscultation bilaterally with equal chest rise  Abdomen: Soft, nontender, nondistended, + bowel sounds  Extremities: warm dry without cyanosis clubbing or edema  Neuro: AAOx3, nonfocal  Psych: Appropriate mood and affect   Data Reviewed: I have personally reviewed following labs and imaging studies  CBC:  Recent Labs Lab 07/02/16 1553 07/02/16 1613  WBC 27.4*  --   NEUTROABS 24.6*  --   HGB 16.6 16.7  HCT 45.9 49.0  MCV 84.1  --   PLT 396  --    Basic Metabolic Panel:  Recent Labs Lab 07/02/16 1553 07/02/16 1613 07/02/16 2111 07/03/16 0041 07/03/16 0810  NA 132* 130* 135 135 135  K 5.8* 5.8* 4.2 4.0 3.8  CL 95* 101 106 109 107  CO2 9*  --  15* 13* 13*  GLUCOSE 596* 624* 202* 151* 169*  BUN 21* 26* 15 11 7   CREATININE 1.26* 0.80 0.98 0.82 0.74  CALCIUM 9.1  --  7.9* 7.7* 8.2*   GFR: Estimated Creatinine Clearance: 116.2 mL/min (by C-G formula based on SCr of 0.74 mg/dL). Liver Function Tests:  Recent Labs Lab 07/02/16 1553  AST 33  ALT 20  ALKPHOS 112  BILITOT 2.4*  PROT 7.6  ALBUMIN 4.8    Recent Labs Lab 07/02/16 1553  LIPASE 30   No results for input(s): AMMONIA in the last 168 hours. Coagulation Profile: No results for input(s): INR, PROTIME in the last 168 hours. Cardiac Enzymes: No results for input(s): CKTOTAL, CKMB, CKMBINDEX, TROPONINI in the last 168 hours. BNP (last 3 results) No results for input(s): PROBNP in the last 8760 hours. HbA1C: No results for input(s): HGBA1C in the last 72 hours. CBG:  Recent Labs Lab 07/03/16 0404 07/03/16 0521  07/03/16 0743 07/03/16 0911 07/03/16 1011  GLUCAP 121* 127* 132* 161* 209*   Lipid Profile: No results for input(s): CHOL, HDL, LDLCALC, TRIG, CHOLHDL, LDLDIRECT in the last 72 hours. Thyroid Function Tests: No results for input(s): TSH, T4TOTAL, FREET4, T3FREE, THYROIDAB in the last 72 hours. Anemia Panel: No results for input(s): VITAMINB12, FOLATE, FERRITIN, TIBC, IRON, RETICCTPCT in the last 72 hours. Urine analysis:    Component Value Date/Time   COLORURINE STRAW (A) 07/02/2016 1530   APPEARANCEUR CLEAR 07/02/2016 1530   LABSPEC 1.023 07/02/2016 1530   PHURINE 5.0 07/02/2016 1530   GLUCOSEU >=500 (A) 07/02/2016 1530   HGBUR NEGATIVE 07/02/2016 1530   BILIRUBINUR NEGATIVE 07/02/2016 1530   KETONESUR 80 (A) 07/02/2016 1530   PROTEINUR NEGATIVE 07/02/2016 1530   UROBILINOGEN 0.2 11/13/2011 1303   NITRITE NEGATIVE 07/02/2016 1530   LEUKOCYTESUR NEGATIVE 07/02/2016 1530   Sepsis Labs: @  LABRCNTIP(procalcitonin:4,lacticidven:4)  ) Recent Results (from the past 240 hour(s))  MRSA PCR Screening     Status: None   Collection Time: 07/02/16  8:59 PM  Result Value Ref Range Status   MRSA by PCR NEGATIVE NEGATIVE Final    Comment:        The GeneXpert MRSA Assay (FDA approved for NASAL specimens only), is one component of a comprehensive MRSA colonization surveillance program. It is not intended to diagnose MRSA infection nor to guide or monitor treatment for MRSA infections.       Radiology Studies: Dg Chest Portable 1 View  Result Date: 07/02/2016 CLINICAL DATA:  Pt c/o weakness, sob, chest discomfort, n/v, onset x "awhile," worsened today. pt states his diabetes medicine changed recently, notes fluctuating blood sugar levels. EXAM: PORTABLE CHEST 1 VIEW COMPARISON:  11/13/2011 FINDINGS: The heart size and mediastinal contours are within normal limits. Both lungs are clear. No pleural effusion or pneumothorax. The visualized skeletal structures are unremarkable.  IMPRESSION: Normal frontal chest radiograph. Electronically Signed   By: Amie Portland M.D.   On: 07/02/2016 15:55     Scheduled Meds: . enoxaparin (LOVENOX) injection  40 mg Subcutaneous Q24H  . famotidine  20 mg Oral Daily  . insulin aspart  0-5 Units Subcutaneous QHS  . insulin glargine  16 Units Subcutaneous QHS  . insulin regular  0-10 Units Intravenous TID WC  . lisinopril  10 mg Oral QHS  . nicotine  7 mg Transdermal Daily   Continuous Infusions: . sodium chloride    . dextrose 5 % and 0.45% NaCl 125 mL/hr at 07/03/16 0800  . dextrose 5 % and 0.45% NaCl    . insulin (NOVOLIN-R) infusion 3 Units/hr (07/03/16 1023)     LOS: 1 day   Time Spent in minutes   30 minutes  Ellora Varnum D.O. on 07/03/2016 at 10:44 AM  Between 7am to 7pm - Pager - [phone]  After 7pm go to [email or web address] - password [email or web address]  And look for the night coverage person covering for me after hours  [facility] Office  [phone]

## 2016-07-03 NOTE — Progress Notes (Signed)
Spoke with pt today.  Patient told me he sees Dr. Adrian PrinceStephen Villa for DM management.  Used to take NPH insulin and Humalog with meals.  Was switched to [place] insulin (basal insulin) about 6 months ago and has been very unhappy with how the Robert Villa has been controlling his CBGs.  Plans to call Dr. Rinaldo Villa's office after d/c to see if he can switch back to NPH insulin.  Patient also told me he may switch providers as well (plans to see if he can get an appt [facility] Talmage [facility]rey [facility] to establish care with another MD.  Did not have any questions for me at this time about his DM regimen.    Also discussed with patient diagnosis of DKA (pathophysiology), treatment of DKA, lab results, and transition plan to SQ insulin regimen.     MD- When pt's labs have stabilized, please make sure pt receives basal insulin 1-2 hours before IV Insulin stopped again.    Patient has been taking 36 units of long-acting insulin at home.    May consider giving 30-36 units Lantus for transition and starting Novolog Sensitive Correction Scale/ SSI (0-9 units) TID AC + HS as well as Novolog Meal Coverage: Novolog 4 units TID with meals.     --Will follow patient during hospitalization--  Robert FinlandJeannine Johnston Saxon Barich RN, MSN, CDE Diabetes Coordinator Inpatient Glycemic Control Team Team Pager: [phone] (8a-5p)

## 2016-07-04 NOTE — Discharge Summary (Signed)
Physician Discharge Summary  Robert Villa ZOX:[id number] DOB: [birth year] DOA: 07/02/2016  PCP: Julian Hy, MD  Admit date: 07/02/2016 Discharge date: 07/03/2016  Time spent: 10 minutes  Recommendations for Outpatient Follow-up:  None left AMA   Discharge Diagnoses:  Active Problems:   DKA, type 1 (HCC)   High anion gap metabolic acidosis   DKA (diabetic ketoacidoses) (HCC)   Essential hypertension   AKI (acute kidney injury) (HCC)   Marijuana abuse   Tobacco abuse   Discharge Condition: not stable  Diet recommendation: none  Filed Weights   07/02/16 1514 07/02/16 2100  Weight: 56.7 kg (125 lb) 56.7 kg (125 lb)    History of present illness:  Robert Villa a 20 y.o.malewith a medical history of diabetes mellitus, type I, hypertension, who presents to the ER with elevated sugars, nausea, vomiting. Patient states that he has been having upper respiratory like symptoms for approximate 2 weeks. He's been experiencing a productive cough along with body aches and congestion. Patient did see his primary care physician approximately one week ago and had a negative flu swab at that time. Patient states that he does smoke marijuana regularly, last use was yesterday evening. He did have nausea and vomiting which awoke him from sleep approximate 9 AM this morning. He has had constant vomiting approximately every 15 minutes before arrival to the emergency department. Also states that he has had weight loss over the last several weeks. He's also been having more frequent urination and polydipsia. He states he's had diabetes since he was 21 years old and has been relatively stable until last 6 months. He was recently switched to [place] but does not feel that his sugars have been adequately controlled. Patient does state that he used short acting insulin yesterday evening however awoke to very elevated blood sugars in the 400s today. Patient denies any recent sick contacts, travel, denies  current chest pain, dizziness, headache, diarrhea, constipation.  Hospital Course:  DKA/Uncontrolled diabetes mellitus type I -Upon admission Blood glucose 624,  anion gap 28, pH7.193, bicarb 9 -Utilized DKA order set, monitor BMP q4hrs and CBG monitoring  -Continue glucose stabilizer -Continue IV fluids -Nothing by mouth (not sure why he was eating breakfast this morning.  -UA and chest x-ray unremarkable for infection -Of note, patient states is hishemoglobin A1c has been very elevated lately around 10 since starting Tresiba. He does follow with Dr. Evlyn Kanner -Not sure why insulin drip was discontinued overnight, anion gap closed, however bicarb still 13 -patient left AMA  Leukocytosis -UA/CXR unremarkable for infection -WBC 27.4 -Likely reactive -Continue to monitor CBC  Possible upper respiratory infection/Cough -Respiratory viral panel neg -mucinex for cough  Pseudohyponatremia -Secondary to DKA, Na improving -Continue IVF, monitor BMP   Hyperkalemia -Resolved.   Nausea/Emesis -Continue antiemetics PRN  Acute kidney injury -Resolved -Creatinine 1.26 upon admission. Creatinine currently 0.74 -Likely secondary to DKA and dehydration -Suspect will improve with IV fluids  Essential hypertension -Continue lisinopril  Marijuana abuse -Drug screen +THC -Last use before admission -Counseled on drug use cessation  Tobacco abuse -Smokes 3-4 cigarettes per day -Counseled on smoking cessation, nicotine patch  Procedures:  none  Consultations:  none  Discharge Exam: Vitals:   07/03/16 1700 07/03/16 1800  BP: 129/65 120/89  Pulse:    Resp: 14 20  Temp:      No exam- patient left AMA  Discharge Instructions  Discharge Medication List as of 07/03/2016  6:23 PM    CONTINUE these medications which have  NOT CHANGED   Details  ibuprofen (ADVIL,MOTRIN) 200 MG tablet Take 200 mg by mouth every 6 (six) hours as needed., Historical Med    insulin  aspart (NOVOLOG) 100 UNIT/ML injection Inject 10 Units into the skin daily with lunch. , Historical Med    lisinopril (PRINIVIL,ZESTRIL) 10 MG tablet Take 10 mg by mouth at bedtime., Historical Med    TRESIBA FLEXTOUCH 200 UNIT/ML SOPN Inject 36 Units into the skin daily., Starting Thu 06/15/2016, Historical Med       No Known Allergies    The results of significant diagnostics from this hospitalization (including imaging, microbiology, ancillary and laboratory) are listed below for reference.    Significant Diagnostic Studies: Ct Abdomen Pelvis W Contrast  Result Date: 06/08/2016 CLINICAL DATA:  Generalized abdominal pain, weight loss EXAM: CT ABDOMEN AND PELVIS WITH CONTRAST TECHNIQUE: Multidetector CT imaging of the abdomen and pelvis was performed using the standard protocol following bolus administration of intravenous contrast. CONTRAST:  100mL ISOVUE-300 IOPAMIDOL (ISOVUE-300) INJECTION 61% COMPARISON:  None. FINDINGS: Lower chest: The lung bases are normal.  Small hiatal hernia. Hepatobiliary: Enhanced liver with normal appearance. No focal hepatic mass. No calcified gallstones are noted within gallbladder. Pancreas: Enhanced pancreas normal. Spleen: Enhanced spleen is normal. Adrenals/Urinary Tract: No adrenal gland mass. Enhanced kidneys are symmetrical in size. No hydronephrosis or hydroureter. The urinary bladder is normal.  No bladder filling defects. Stomach/Bowel: Oral contrast material was given to the patient. No small bowel obstruction. Terminal ileum is unremarkable. No pericecal inflammation. Normal appendix is partially visualized in coronal image 28. No colitis or diverticulitis. No distal colonic obstruction. Vascular/Lymphatic: Abdominal aorta is unremarkable.  No adenopathy. Reproductive: Prostate gland and seminal vesicles are unremarkable. Other: Small nonspecific bilateral inguinal lymph nodes are noted. No ascites or free abdominal air. Musculoskeletal: No destructive  bony lesions are noted. Sagittal images of the spine are unremarkable. IMPRESSION: 1. No acute inflammatory process within abdomen. 2. No hydronephrosis or hydroureter. 3. Normal appendix.  No pericecal inflammation. 4. No colitis or diverticulitis.  No colonic obstruction. Electronically Signed   By: Natasha MeadLiviu  Pop M.D.   On: 06/08/2016 16:09   Dg Chest Portable 1 View  Result Date: 07/02/2016 CLINICAL DATA:  Pt c/o weakness, sob, chest discomfort, n/v, onset x "awhile," worsened today. pt states his diabetes medicine changed recently, notes fluctuating blood sugar levels. EXAM: PORTABLE CHEST 1 VIEW COMPARISON:  11/13/2011 FINDINGS: The heart size and mediastinal contours are within normal limits. Both lungs are clear. No pleural effusion or pneumothorax. The visualized skeletal structures are unremarkable. IMPRESSION: Normal frontal chest radiograph. Electronically Signed   By: Amie Portlandavid  Ormond M.D.   On: 07/02/2016 15:55    Microbiology: Recent Results (from the past 240 hour(s))  Respiratory Panel by PCR     Status: None   Collection Time: 07/02/16  8:24 PM  Result Value Ref Range Status   Adenovirus NOT DETECTED NOT DETECTED Final   Coronavirus 229E NOT DETECTED NOT DETECTED Final   Coronavirus HKU1 NOT DETECTED NOT DETECTED Final   Coronavirus NL63 NOT DETECTED NOT DETECTED Final   Coronavirus OC43 NOT DETECTED NOT DETECTED Final   Metapneumovirus NOT DETECTED NOT DETECTED Final   Rhinovirus / Enterovirus NOT DETECTED NOT DETECTED Final   Influenza A NOT DETECTED NOT DETECTED Final   Influenza B NOT DETECTED NOT DETECTED Final   Parainfluenza Virus 1 NOT DETECTED NOT DETECTED Final   Parainfluenza Virus 2 NOT DETECTED NOT DETECTED Final   Parainfluenza Virus 3 NOT DETECTED  NOT DETECTED Final   Parainfluenza Virus 4 NOT DETECTED NOT DETECTED Final   Respiratory Syncytial Virus NOT DETECTED NOT DETECTED Final   Bordetella pertussis NOT DETECTED NOT DETECTED Final   Chlamydophila pneumoniae  NOT DETECTED NOT DETECTED Final   Mycoplasma pneumoniae NOT DETECTED NOT DETECTED Final    Comment: Performed at [facility]  MRSA PCR Screening     Status: None   Collection Time: 07/02/16  8:59 PM  Result Value Ref Range Status   MRSA by PCR NEGATIVE NEGATIVE Final    Comment:        The GeneXpert MRSA Assay (FDA approved for NASAL specimens only), is one component of a comprehensive MRSA colonization surveillance program. It is not intended to diagnose MRSA infection nor to guide or monitor treatment for MRSA infections.      Labs: Basic Metabolic Panel:  Recent Labs Lab 07/02/16 2111 07/03/16 0041 07/03/16 0810 07/03/16 1156 07/03/16 1636  NA 135 135 135 134* 136  K 4.2 4.0 3.8 3.6 3.1*  CL 106 109 107 106 106  CO2 15* 13* 13* 17* 17*  GLUCOSE 202* 151* 169* 159* 122*  BUN 15 11 7  5* <5*  CREATININE 0.98 0.82 0.74 0.67 0.64  CALCIUM 7.9* 7.7* 8.2* 8.1* 8.0*   Liver Function Tests:  Recent Labs Lab 07/02/16 1553  AST 33  ALT 20  ALKPHOS 112  BILITOT 2.4*  PROT 7.6  ALBUMIN 4.8    Recent Labs Lab 07/02/16 1553  LIPASE 30   No results for input(s): AMMONIA in the last 168 hours. CBC:  Recent Labs Lab 07/02/16 1553 07/02/16 1613 07/03/16 0041  WBC 27.4*  --  21.6*  NEUTROABS 24.6*  --   --   HGB 16.6 16.7 13.3  HCT 45.9 49.0 38.3*  MCV 84.1  --  84.7  PLT 396  --  282   Cardiac Enzymes: No results for input(s): CKTOTAL, CKMB, CKMBINDEX, TROPONINI in the last 168 hours. BNP: BNP (last 3 results) No results for input(s): BNP in the last 8760 hours.  ProBNP (last 3 results) No results for input(s): PROBNP in the last 8760 hours.  CBG:  Recent Labs Lab 07/03/16 1304 07/03/16 1413 07/03/16 1540 07/03/16 1622 07/03/16 1748  GLUCAP 109* 110* 116* 117* 123*       Signed:  Edsel Petrin  [facility] 07/04/2016, 6:32 PM

## 2016-07-17 ENCOUNTER — Encounter: Payer: Self-pay | Admitting: Nurse Practitioner

## 2018-05-26 ENCOUNTER — Observation Stay (HOSPITAL_COMMUNITY)
Admission: EM | Admit: 2018-05-26 | Discharge: 2018-05-27 | Disposition: A | Discharge status: Home / Self Care | Payer: BLUE CROSS/BLUE SHIELD | Attending: Internal Medicine | Admitting: Internal Medicine

## 2018-05-26 ENCOUNTER — Emergency Department (HOSPITAL_COMMUNITY): Payer: BLUE CROSS/BLUE SHIELD

## 2018-05-26 ENCOUNTER — Observation Stay (HOSPITAL_COMMUNITY): Payer: BLUE CROSS/BLUE SHIELD

## 2018-05-26 ENCOUNTER — Other Ambulatory Visit: Payer: Self-pay

## 2018-05-26 ENCOUNTER — Encounter (HOSPITAL_COMMUNITY): Payer: Self-pay

## 2018-05-26 DIAGNOSIS — E1010 Type 1 diabetes mellitus with ketoacidosis without coma: Secondary | ICD-10-CM | POA: Diagnosis not present

## 2018-05-26 DIAGNOSIS — E1021 Type 1 diabetes mellitus with diabetic nephropathy: Secondary | ICD-10-CM | POA: Diagnosis not present

## 2018-05-26 DIAGNOSIS — R197 Diarrhea, unspecified: Secondary | ICD-10-CM

## 2018-05-26 DIAGNOSIS — I10 Essential (primary) hypertension: Secondary | ICD-10-CM | POA: Insufficient documentation

## 2018-05-26 DIAGNOSIS — F419 Anxiety disorder, unspecified: Secondary | ICD-10-CM | POA: Insufficient documentation

## 2018-05-26 DIAGNOSIS — R10811 Right upper quadrant abdominal tenderness: Secondary | ICD-10-CM

## 2018-05-26 DIAGNOSIS — F329 Major depressive disorder, single episode, unspecified: Secondary | ICD-10-CM | POA: Insufficient documentation

## 2018-05-26 DIAGNOSIS — Z794 Long term (current) use of insulin: Secondary | ICD-10-CM | POA: Diagnosis not present

## 2018-05-26 DIAGNOSIS — Z791 Long term (current) use of non-steroidal anti-inflammatories (NSAID): Secondary | ICD-10-CM | POA: Diagnosis not present

## 2018-05-26 DIAGNOSIS — R112 Nausea with vomiting, unspecified: Secondary | ICD-10-CM

## 2018-05-26 DIAGNOSIS — Z79899 Other long term (current) drug therapy: Secondary | ICD-10-CM | POA: Diagnosis not present

## 2018-05-26 DIAGNOSIS — Z9114 Patient's other noncompliance with medication regimen: Secondary | ICD-10-CM | POA: Diagnosis not present

## 2018-05-26 DIAGNOSIS — F17210 Nicotine dependence, cigarettes, uncomplicated: Secondary | ICD-10-CM | POA: Insufficient documentation

## 2018-05-26 DIAGNOSIS — E860 Dehydration: Secondary | ICD-10-CM | POA: Diagnosis present

## 2018-05-26 DIAGNOSIS — E1110 Type 2 diabetes mellitus with ketoacidosis without coma: Secondary | ICD-10-CM | POA: Diagnosis present

## 2018-05-26 LAB — COMPREHENSIVE METABOLIC PANEL
ALK PHOS: 85 U/L (ref 38–126)
ALT: 15 U/L (ref 0–44)
AST: 16 U/L (ref 15–41)
Albumin: 4.5 g/dL (ref 3.5–5.0)
Anion gap: 22 — ABNORMAL HIGH (ref 5–15)
BUN: 13 mg/dL (ref 6–20)
CALCIUM: 8.4 mg/dL — AB (ref 8.9–10.3)
CO2: 11 mmol/L — AB (ref 22–32)
CREATININE: 1.09 mg/dL (ref 0.61–1.24)
Chloride: 101 mmol/L (ref 98–111)
GLUCOSE: 413 mg/dL — AB (ref 70–99)
Potassium: 4.9 mmol/L (ref 3.5–5.1)
SODIUM: 134 mmol/L — AB (ref 135–145)
Total Bilirubin: 1.7 mg/dL — ABNORMAL HIGH (ref 0.3–1.2)
Total Protein: 7.3 g/dL (ref 6.5–8.1)

## 2018-05-26 LAB — CBC
HCT: 45.5 % (ref 39.0–52.0)
Hemoglobin: 15 g/dL (ref 13.0–17.0)
MCH: 30 pg (ref 26.0–34.0)
MCHC: 33 g/dL (ref 30.0–36.0)
MCV: 91 fL (ref 80.0–100.0)
Platelets: 261 10*3/uL (ref 150–400)
RBC: 5 MIL/uL (ref 4.22–5.81)
RDW: 11.8 % (ref 11.5–15.5)
WBC: 19.6 10*3/uL — ABNORMAL HIGH (ref 4.0–10.5)
nRBC: 0 % (ref 0.0–0.2)

## 2018-05-26 LAB — CBC WITH DIFFERENTIAL/PLATELET
ABS IMMATURE GRANULOCYTES: 0.14 10*3/uL — AB (ref 0.00–0.07)
BASOS PCT: 1 %
Basophils Absolute: 0.1 10*3/uL (ref 0.0–0.1)
Eosinophils Absolute: 0.1 10*3/uL (ref 0.0–0.5)
Eosinophils Relative: 0 %
HCT: 46.7 % (ref 39.0–52.0)
HEMOGLOBIN: 15.7 g/dL (ref 13.0–17.0)
IMMATURE GRANULOCYTES: 1 %
LYMPHS PCT: 8 %
Lymphs Abs: 1.3 10*3/uL (ref 0.7–4.0)
MCH: 30 pg (ref 26.0–34.0)
MCHC: 33.6 g/dL (ref 30.0–36.0)
MCV: 89.1 fL (ref 80.0–100.0)
MONO ABS: 0.8 10*3/uL (ref 0.1–1.0)
MONOS PCT: 5 %
NEUTROS ABS: 13.2 10*3/uL — AB (ref 1.7–7.7)
NEUTROS PCT: 85 %
PLATELETS: 305 10*3/uL (ref 150–400)
RBC: 5.24 MIL/uL (ref 4.22–5.81)
RDW: 11.7 % (ref 11.5–15.5)
WBC: 15.6 10*3/uL — ABNORMAL HIGH (ref 4.0–10.5)
nRBC: 0 % (ref 0.0–0.2)

## 2018-05-26 LAB — URINALYSIS, ROUTINE W REFLEX MICROSCOPIC
BACTERIA UA: NONE SEEN
Bilirubin Urine: NEGATIVE
Glucose, UA: >=500 mg/dL — AB
Ketones, ur: 80 mg/dL — AB
Leukocytes, UA: NEGATIVE
Nitrite: NEGATIVE
Protein, ur: NEGATIVE mg/dL
SPECIFIC GRAVITY, URINE: 1.023 (ref 1.005–1.030)
pH: 5 (ref 5.0–8.0)

## 2018-05-26 LAB — BASIC METABOLIC PANEL
Anion gap: 15 (ref 5–15)
BUN: 9 mg/dL (ref 6–20)
CHLORIDE: 110 mmol/L (ref 98–111)
CO2: 11 mmol/L — ABNORMAL LOW (ref 22–32)
Calcium: 7.9 mg/dL — ABNORMAL LOW (ref 8.9–10.3)
Creatinine, Ser: 1.09 mg/dL (ref 0.61–1.24)
GFR calc Af Amer: >60 mL/min (ref >60)
GFR calc non Af Amer: >60 mL/min (ref >60)
Glucose, Bld: 144 mg/dL — ABNORMAL HIGH (ref 70–99)
Potassium: 4.3 mmol/L (ref 3.5–5.1)
SODIUM: 136 mmol/L (ref 135–145)

## 2018-05-26 LAB — CBG MONITORING, ED
Glucose-Capillary: 347 mg/dL — ABNORMAL HIGH (ref 70–99)
Glucose-Capillary: 277 mg/dL — ABNORMAL HIGH (ref 70–99)
Glucose-Capillary: 197 mg/dL — ABNORMAL HIGH (ref 70–99)

## 2018-05-26 LAB — BLOOD GAS, VENOUS
Acid-base deficit: 19.4 mmol/L — ABNORMAL HIGH (ref 0.0–2.0)
Bicarbonate: 7.7 mmol/L — ABNORMAL LOW (ref 20.0–28.0)
O2 Saturation: 83.4 %
PCO2 VEN: 21.4 mmHg — AB (ref 44.0–60.0)
PH VEN: 7.183 — AB (ref 7.250–7.430)
PO2 VEN: 54.6 mmHg — AB (ref 32.0–45.0)
Patient temperature: 98.6

## 2018-05-26 LAB — MRSA PCR SCREENING: MRSA BY PCR: NEGATIVE

## 2018-05-26 LAB — MAGNESIUM
Magnesium: 1.5 mg/dL — ABNORMAL LOW (ref 1.7–2.4)
Magnesium: 2.1 mg/dL (ref 1.7–2.4)

## 2018-05-26 LAB — GLUCOSE, CAPILLARY
Glucose-Capillary: 158 mg/dL — ABNORMAL HIGH (ref 70–99)
Glucose-Capillary: 136 mg/dL — ABNORMAL HIGH (ref 70–99)

## 2018-05-26 LAB — PHOSPHORUS: PHOSPHORUS: 3.8 mg/dL (ref 2.5–4.6)

## 2018-05-26 LAB — LIPASE, BLOOD: Lipase: 18 U/L (ref 11–51)

## 2018-05-26 MED ORDER — SODIUM CHLORIDE 0.9 % IV SOLN
INTRAVENOUS | Status: DC
  Administered 2018-05-26: 23:00:00 via INTRAVENOUS

## 2018-05-26 MED ORDER — ONDANSETRON HCL 4 MG/2ML IJ SOLN: 4.0000 mg | Freq: Four times a day (QID) | INTRAMUSCULAR | Status: DC | PRN

## 2018-05-26 MED ORDER — METOCLOPRAMIDE HCL 5 MG/ML IJ SOLN
10.0000 mg | Freq: Once | INTRAMUSCULAR | Status: AC
  Administered 2018-05-26: 10 mg via INTRAVENOUS
  Filled 2018-05-26: qty 2

## 2018-05-26 MED ORDER — FAMOTIDINE IN NACL 20-0.9 MG/50ML-% IV SOLN
20.0000 mg | Freq: Once | INTRAVENOUS | Status: AC
  Administered 2018-05-26: 20 mg via INTRAVENOUS
  Filled 2018-05-26: qty 50

## 2018-05-26 MED ORDER — BUPROPION HCL ER (XL) 300 MG PO TB24
300.0000 mg | ORAL_TABLET | Freq: Every day | ORAL | Status: DC
  Administered 2018-05-27: 300 mg via ORAL
  Filled 2018-05-26: qty 1

## 2018-05-26 MED ORDER — ENOXAPARIN SODIUM 40 MG/0.4ML ~~LOC~~ SOLN
40.0000 mg | SUBCUTANEOUS | Status: DC
  Filled 2018-05-26: qty 0.4

## 2018-05-26 MED ORDER — INSULIN REGULAR(HUMAN) IN NACL 100-0.9 UT/100ML-% IV SOLN
INTRAVENOUS | Status: DC
  Administered 2018-05-26: 3.5 [IU]/h via INTRAVENOUS
  Filled 2018-05-26: qty 100

## 2018-05-26 MED ORDER — ONDANSETRON HCL 4 MG PO TABS: 4.0000 mg | ORAL_TABLET | Freq: Four times a day (QID) | ORAL | Status: DC | PRN

## 2018-05-26 MED ORDER — ACETAMINOPHEN 650 MG RE SUPP: 650.0000 mg | Freq: Four times a day (QID) | RECTAL | Status: DC | PRN

## 2018-05-26 MED ORDER — ALPRAZOLAM 0.5 MG PO TABS
0.5000 mg | ORAL_TABLET | Freq: Two times a day (BID) | ORAL | Status: DC | PRN
Start: 2018-05-26 — End: 2018-05-27

## 2018-05-26 MED ORDER — MAGNESIUM SULFATE 2 GM/50ML IV SOLN
2.0000 g | Freq: Once | INTRAVENOUS | Status: AC
  Administered 2018-05-26: 2 g via INTRAVENOUS
  Filled 2018-05-26: qty 50

## 2018-05-26 MED ORDER — ACETAMINOPHEN 325 MG PO TABS: 650.0000 mg | ORAL_TABLET | Freq: Four times a day (QID) | ORAL | Status: DC | PRN

## 2018-05-26 MED ORDER — SODIUM CHLORIDE 0.9 % IV BOLUS
2000.0000 mL | Freq: Once | INTRAVENOUS | Status: AC
  Administered 2018-05-26: 2000 mL via INTRAVENOUS

## 2018-05-26 MED ORDER — DIPHENHYDRAMINE HCL 50 MG/ML IJ SOLN
25.0000 mg | Freq: Once | INTRAMUSCULAR | Status: AC
  Administered 2018-05-26: 25 mg via INTRAVENOUS
  Filled 2018-05-26: qty 1

## 2018-05-26 MED ORDER — FAMOTIDINE IN NACL 20-0.9 MG/50ML-% IV SOLN
20.0000 mg | INTRAVENOUS | Status: DC
  Administered 2018-05-26: 20 mg via INTRAVENOUS
  Filled 2018-05-26: qty 50

## 2018-05-26 MED ORDER — IBUPROFEN 200 MG PO TABS: 200.0000 mg | ORAL_TABLET | Freq: Four times a day (QID) | ORAL | Status: DC | PRN

## 2018-05-26 MED ORDER — MAGNESIUM SULFATE 50 % IJ SOLN: 1.0000 g | Freq: Once | INTRAMUSCULAR | Status: DC

## 2018-05-26 MED ORDER — DEXTROSE-NACL 5-0.45 % IV SOLN
INTRAVENOUS | Status: DC
  Administered 2018-05-26: 21:00:00 via INTRAVENOUS

## 2018-05-26 NOTE — ED Notes (Signed)
ED TO INPATIENT HANDOFF REPORT  Name/Age/Gender Robert Villa 22 y.o. male  Code Status Code Status History    Date Active Date Inactive Code Status Order ID Comments User Context   07/02/2016 2058 07/03/2016 2128 Full Code [id number]  Edsel PetrinMikhail, Maryann, DO Inpatient   11/12/2011 1302 11/15/2011 1647 Full Code [id number]  Osvaldo Humanavidson, Alan III, MD ED      Home/SNF/Other Home  Chief Complaint N/V/D - anxiety  Level of Care/Admitting Diagnosis ED Disposition    ED Disposition Condition Comment   Admit  [facility] [[id number]]  Level of Care: Stepdown [14]  Admit to SDU based on following criteria: Hemodynamic compromise or significant risk of instability:  Patient requiring short term acute titration and management of vasoactive drips, and invasive monitoring (i.e., CVP and Arterial line).  Diagnosis: DKA (diabetic ketoacidoses) [facility](HCC) [id number]]  Admitting Physician: Coralie KeensARRIEN, MAURICIO DANIEL [[id number]]  Attending Physician: Coralie KeensARRIEN, MAURICIO DANIEL [phone]]  PT Class (Do Not Modify): Observation [104]  PT Acc Code (Do Not Modify): Observation [10022]       Medical History Past Medical History:  Diagnosis Date  . Diabetes mellitus   . Essential hypertension   . Mental disorder     Allergies Allergies  Allergen Reactions  . Claritin [Loratadine]     Nose bleeds  . Zoloft [Sertraline Hcl]     Heavy Sweats & Jittery    IV Location/Drains/Wounds Patient Lines/Drains/Airways Status   Active Line/Drains/Airways    Name:   Placement date:   Placement time:   Site:   Days:   Peripheral IV 05/26/18 Left Antecubital   05/26/18    1344    Antecubital   less than 1          Labs/Imaging Results for orders placed or performed during the hospital encounter of 05/26/18 (from the past 48 hour(s))  CBG monitoring, ED     Status: Abnormal   Collection Time: 05/26/18  1:34 PM  Result Value Ref Range   Glucose-Capillary 347 (H) 70 - 99 mg/dL   Comment 1  Notify RN    Comment 2 Document in Chart   CBC with Differential     Status: Abnormal   Collection Time: 05/26/18  2:31 PM  Result Value Ref Range   WBC 15.6 (H) 4.0 - 10.5 K/uL   RBC 5.24 4.22 - 5.81 MIL/uL   Hemoglobin 15.7 13.0 - 17.0 g/dL   HCT 53.646.7 64.439.0 - 03.452.0 %   MCV 89.1 80.0 - 100.0 fL   MCH 30.0 26.0 - 34.0 pg   MCHC 33.6 30.0 - 36.0 g/dL   RDW 74.211.7 59.511.5 - 63.815.5 %   Platelets 305 150 - 400 K/uL   nRBC 0.0 0.0 - 0.2 %   Neutrophils Relative % 85 %   Neutro Abs 13.2 (H) 1.7 - 7.7 K/uL   Lymphocytes Relative 8 %   Lymphs Abs 1.3 0.7 - 4.0 K/uL   Monocytes Relative 5 %   Monocytes Absolute 0.8 0.1 - 1.0 K/uL   Eosinophils Relative 0 %   Eosinophils Absolute 0.1 0.0 - 0.5 K/uL   Basophils Relative 1 %   Basophils Absolute 0.1 0.0 - 0.1 K/uL   Immature Granulocytes 1 %   Abs Immature Granulocytes 0.14 (H) 0.00 - 0.07 K/uL    Comment: Performed at [facility], [address] [place], [place] [id number]  Comprehensive metabolic panel     Status: Abnormal   Collection Time: 05/26/18  2:31 PM  Result Value Ref Range   Sodium 134 (L) 135 - 145 mmol/L   Potassium 4.9 3.5 - 5.1 mmol/L   Chloride 101 98 - 111 mmol/L   CO2 11 (L) 22 - 32 mmol/L   Glucose, Bld 413 (H) 70 - 99 mg/dL   BUN 13 6 - 20 mg/dL   Creatinine, Ser 1.61 0.61 - 1.24 mg/dL   Calcium 8.4 (L) 8.9 - 10.3 mg/dL   Total Protein 7.3 6.5 - 8.1 g/dL   Albumin 4.5 3.5 - 5.0 g/dL   AST 16 15 - 41 U/L   ALT 15 0 - 44 U/L   Alkaline Phosphatase 85 38 - 126 U/L   Total Bilirubin 1.7 (H) 0.3 - 1.2 mg/dL   GFR calc non Af Amer >60 >60 mL/min   GFR calc Af Amer >60 >60 mL/min   Anion gap 22 (H) 5 - 15    Comment: REPEATED TO VERIFY Performed at [facility], [address], [place], [place] [address]   Lipase, blood     Status: None   Collection Time: 05/26/18  2:31 PM  Result Value Ref Range   Lipase 18 11 - 51 U/L    Comment: Performed at [facility], [address], [place], [place] [address]  Magnesium     Status: Abnormal   Collection Time: 05/26/18  2:31 PM  Result Value Ref Range   Magnesium 1.5 (L) 1.7 - 2.4 mg/dL    Comment: Performed at [facility], [address] [place], [place] [address]  Phosphorus     Status: None   Collection Time: 05/26/18  2:31 PM  Result Value Ref Range   Phosphorus 3.8 2.5 - 4.6 mg/dL    Comment: Performed at [facility], [address], [place], [place] [address]  Urinalysis, Routine w reflex microscopic     Status: Abnormal   Collection Time: 05/26/18  3:03 PM  Result Value Ref Range   Color, Urine STRAW (A) YELLOW   APPearance CLEAR CLEAR   Specific Gravity, Urine 1.023 1.005 - 1.030   pH 5.0 5.0 - 8.0   Glucose, UA >=500 (A) NEGATIVE mg/dL   Hgb urine dipstick SMALL (A) NEGATIVE   Bilirubin Urine NEGATIVE NEGATIVE   Ketones, ur 80 (A) NEGATIVE mg/dL   Protein, ur NEGATIVE NEGATIVE mg/dL   Nitrite NEGATIVE NEGATIVE   Leukocytes, UA NEGATIVE NEGATIVE   RBC / HPF 0-5 0 - 5 RBC/hpf   WBC, UA 0-5 0 - 5 WBC/hpf   Bacteria, UA NONE SEEN NONE SEEN   Mucus PRESENT     Comment: Performed at [facility], [address], [place], [place] [address]  Blood gas, venous     Status: Abnormal   Collection Time: 05/26/18  4:28 PM  Result Value Ref Range   pH, Ven 7.183 (LL) 7.250 - 7.430    Comment: [facility]BV M.STREET,PA BY LISA CRADDOCK,RRT,RCP ON 05/26/18 AT 1635   pCO2, Ven 21.4 (L) 44.0 - 60.0 mmHg   pO2, Ven 54.6 (H) 32.0 - 45.0 mmHg   Bicarbonate 7.7 (L) 20.0 - 28.0 mmol/L   Acid-base deficit 19.4 (H) 0.0 - 2.0 mmol/L   O2 Saturation 83.4 %   Patient temperature 98.6    Collection site VEIN    Drawn by DRAWN BY RN    Sample type VEIN     Comment: Performed at [facility], [address], [place], [place] [address]  CBG monitoring, ED     Status: Abnormal   Collection Time: 05/26/18  6:32 PM  Result Value Ref Range    Glucose-Capillary 277 (H) 70 - 99 mg/dL   US Abdomen Limited Ruq  Result Date: 05/26/2018 CLINICAL DATA:  Right upper quadrant tenderness EXAM: ULTRASOUND ABDOMEN LIMITED RIGHT UPPER QUADRANT COMPARISON:  None. FINDINGS: Gallbladder: No gallstones or wall thickening visualized. No sonographic Murphy sign noted by sonographer. Common bile duct: Diameter: 2.2 mm Liver: No focal lesion identified. Within normal limits in parenchymal echogenicity. Portal vein is patent on color Doppler imaging with normal direction of blood flow towards the liver. IMPRESSION: No cholelithiasis or sonographic evidence of acute cholecystitis. Electronically Signed   By: Elige Ko   On: 05/26/2018 17:41   None  Pending Labs Unresulted Labs (From admission, onward)    Start     Ordered   Signed and Held  HIV antibody (Routine Testing)  Once,   R     Signed and Held   Signed and Held  CBC  (enoxaparin (LOVENOX)    CrCl >/= 30 ml/min)  Once,   R    Comments:  Baseline for enoxaparin therapy IF NOT ALREADY DRAWN.  Notify MD if PLT < 100 K.    Signed and Held   Signed and Held  Creatinine, serum  (enoxaparin (LOVENOX)    CrCl >/= 30 ml/min)  Once,   R    Comments:  Baseline for enoxaparin therapy IF NOT ALREADY DRAWN.    Signed and Held   Signed and Held  Creatinine, serum  (enoxaparin (LOVENOX)    CrCl >/= 30 ml/min)  Weekly,   R    Comments:  while on enoxaparin therapy    Signed and Held   Signed and Held  Basic metabolic panel  Now then every 4 hours,   R     Signed and Held   Signed and Held  Magnesium  Once,   R     Signed and Held          Vitals/Pain Today's Vitals   05/26/18 1712 05/26/18 1730 05/26/18 1800 05/26/18 1813  BP: 122/60 (!) 117/59 122/66 122/66  Pulse: (!) 128 (!) 122 (!) 121 (!) 121  Resp: 18   18  Temp:      SpO2: 98% 98% 97% 97%  Weight:      Height:      PainSc:        Isolation Precautions No active isolations  Medications Medications  dextrose 5 %-0.45 % sodium  chloride infusion (has no administration in time range)  insulin regular, human (MYXREDLIN) 100 units/ 100 mL infusion (2.2 Units/hr Intravenous Rate/Dose Change 05/26/18 1834)  sodium chloride 0.9 % bolus 2,000 mL (0 mLs Intravenous Stopped 05/26/18 1559)  metoCLOPramide (REGLAN) injection 10 mg (10 mg Intravenous Given 05/26/18 1443)  diphenhydrAMINE (BENADRYL) injection 25 mg (25 mg Intravenous Given 05/26/18 1444)  famotidine (PEPCID) IVPB 20 mg premix (0 mg Intravenous Stopped 05/26/18 1559)  magnesium sulfate IVPB 2 g 50 mL (0 g Intravenous Stopped 05/26/18 1741)    Mobility walks

## 2018-05-26 NOTE — ED Provider Notes (Signed)
[facility]-EMERGENCY DEPT Provider Note   CSN: [id number] Arrival date & time: 05/26/18  1321     History   Chief Complaint Chief Complaint  Patient presents with  . Anxiety  . Nausea  . Emesis  . Diarrhea    HPI Robert Villa is a 22 y.o. male with a PMHx of DM1 and HTN, who presents to the ED with complaints of here with issues with anxiety as well as nausea, vomiting, and diarrhea.  Patient states that his girlfriend of 8 years broke up with him 4 days ago, and since then he has been laying around in bed very depressed, not eating or drinking very much.  He states that when he has anxiety and stress issues, he has nausea and vomiting and diarrhea that come along with it.  He has been trying to stay hydrated but has been unable to hydrate himself adequately.  He has been having tingling in his hands and legs, anxiety, palpitations, 2-3 episodes per day of nonbloody nonbilious emesis and one episode daily of nonbloody diarrhea.  He has tried his alprazolam 0.5 mg twice daily without relief.  Stress makes his symptoms worse.  He uses Novolin 20 units twice daily which he took last night, and sliding scale NovoLog, last dose of that was 5 units at 10:30 AM.  He denies fevers, chills, CP, SOB, abd pain, constipation, obstipation, melena, hematochezia, hematemesis, hematuria, dysuria,myalgias, arthralgias, numbness, focal weakness, or any other complaints at this time. Denies recent travel, sick contacts, suspicious food intake, EtOH use, or prior abd surgeries. Endorses regular NSAID use. He denies SI/HI/AVH. His PCP is at [facility].  The history is provided by the patient and medical records. No language interpreter was used.  Anxiety  Pertinent negatives include no chest pain, no abdominal pain and no shortness of breath.  Emesis   Associated symptoms include diarrhea. Pertinent negatives include no abdominal pain, no arthralgias, no chills, no fever and no  myalgias.  Diarrhea   Associated symptoms include vomiting. Pertinent negatives include no abdominal pain, no chills, no arthralgias and no myalgias.    Past Medical History:  Diagnosis Date  . Diabetes mellitus   . Essential hypertension   . Mental disorder     Patient Active Problem List   Diagnosis Date Noted  . DKA (diabetic ketoacidoses) (HCC) 07/02/2016  . Essential hypertension 07/02/2016  . AKI (acute kidney injury) (HCC) 07/02/2016  . Marijuana abuse 07/02/2016  . Tobacco abuse 07/02/2016  . Tachycardia 11/14/2011  . Volume depletion 11/14/2011  . 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 DKA, type 1 (HCC) 11/13/2011  . Psychosis (HCC) 11/13/2011  . High anion gap metabolic acidosis 11/13/2011  . Leucocytosis 11/13/2011    Past Surgical History:  Procedure Laterality Date  . ANKLE SURGERY    . NOSE SURGERY          Home Medications    Prior to Admission medications   Medication Sig Start Date End Date Taking? Authorizing Provider  ibuprofen (ADVIL,MOTRIN) 200 MG tablet Take 200 mg by mouth every 6 (six) hours as needed.    [provider]  insulin aspart (NOVOLOG) 100 UNIT/ML injection Inject 10 Units into the skin daily with lunch.     [provider]  lisinopril (PRINIVIL,ZESTRIL) 10 MG tablet Take 10 mg by mouth at bedtime.    [provider]  TRESIBA FLEXTOUCH 200 UNIT/ML SOPN Inject 36 Units into the  skin daily. 06/15/16   [provider]    Family History History reviewed. No pertinent family history.  Social History Social History   Tobacco Use  . Smoking status: Current Some Day Smoker    Packs/day: 0.50    Types: Cigarettes, E-cigarettes  . Smokeless tobacco: Never Used  Substance Use Topics  . Alcohol use: No  . Drug use: Yes    Types: Marijuana     Allergies   Patient has no known allergies.   Review of Systems Review of Systems  Constitutional:  Positive for appetite change. Negative for chills and fever.  Respiratory: Negative for shortness of breath.   Cardiovascular: Positive for palpitations. Negative for chest pain.  Gastrointestinal: Positive for diarrhea, nausea and vomiting. Negative for abdominal pain and constipation.  Genitourinary: Negative for dysuria and hematuria.  Musculoskeletal: Negative for arthralgias and myalgias.  Skin: Negative for color change.  Allergic/Immunologic: Positive for immunocompromised state (DM1).  Neurological: Negative for weakness and numbness.  Psychiatric/Behavioral: Negative for confusion, hallucinations and suicidal ideas. The patient is nervous/anxious.    All other systems reviewed and are negative for acute change except as noted in the HPI.    Physical Exam Updated Vital Signs BP (!) 142/106 (BP Location: Right Arm)   Pulse (!) 126   Temp 98.9 F (37.2 C)   Resp 18   Ht 5\' 7"  (1.702 m)   Wt 67.6 kg   SpO2 98%   BMI 23.34 kg/m   Physical Exam  Constitutional: He is oriented to person, place, and time. Vital signs are normal. He appears well-developed and well-nourished.  Non-toxic appearance. No distress.  Afebrile, nontoxic, NAD although very anxious, appears dehydrated  HENT:  Head: Normocephalic and atraumatic.  Mouth/Throat: Oropharynx is clear and moist. Mucous membranes are dry.  Lips very dry  Eyes: Conjunctivae and EOM are normal. Right eye exhibits no discharge. Left eye exhibits no discharge.  Neck: Normal range of motion. Neck supple.  Cardiovascular: Regular rhythm, normal heart sounds and intact distal pulses. Tachycardia present. Exam reveals no gallop and no friction rub.  No murmur heard. HR 120s  Pulmonary/Chest: Effort normal and breath sounds normal. No respiratory distress. He has no decreased breath sounds. He has no wheezes. He has no rhonchi. He has no rales.  Slight kussmaul respirations  Abdominal: Soft. Normal appearance and bowel sounds are  normal. He exhibits no distension. There is tenderness in the right upper quadrant. There is positive Murphy's sign. There is no rigidity, no rebound, no guarding, no CVA tenderness and no tenderness at McBurney's point.  Soft, nondistended, +BS throughout, with moderate RUQ TTP, no r/g/r, +murphy's, neg mcburney's, no CVA TTP   Musculoskeletal: Normal range of motion.  Neurological: He is alert and oriented to person, place, and time. He has normal strength. No sensory deficit.  Skin: Skin is warm, dry and intact. No rash noted.  Psychiatric: He has a normal mood and affect.  Nursing note and vitals reviewed.    ED Treatments / Results  Labs (all labs ordered are listed, but only abnormal results are displayed) Labs Reviewed  CBC WITH DIFFERENTIAL/PLATELET - Abnormal; Notable for the following components:      Result Value   WBC 15.6 (*)    Neutro Abs 13.2 (*)    Abs Immature Granulocytes 0.14 (*)    All other components within normal limits  COMPREHENSIVE METABOLIC PANEL - Abnormal; Notable for the following components:   Sodium 134 (*)    CO2  11 (*)    Glucose, Bld 413 (*)    Calcium 8.4 (*)    Total Bilirubin 1.7 (*)    Anion gap 22 (*)    All other components within normal limits  URINALYSIS, ROUTINE W REFLEX MICROSCOPIC - Abnormal; Notable for the following components:   Color, Urine STRAW (*)    Glucose, UA >=500 (*)    Hgb urine dipstick SMALL (*)    Ketones, ur 80 (*)    All other components within normal limits  MAGNESIUM - Abnormal; Notable for the following components:   Magnesium 1.5 (*)    All other components within normal limits  CBG MONITORING, ED - Abnormal; Notable for the following components:   Glucose-Capillary 347 (*)    All other components within normal limits  LIPASE, BLOOD  PHOSPHORUS  BLOOD GAS, VENOUS    EKG None  Radiology No results found.  Procedures Procedures (including critical care time)  CRITICAL CARE-DKA Performed by:  Rhona RaiderMercedes Santina Trillo   Total critical care time: 55 minutes  Critical care time was exclusive of separately billable procedures and treating other patients.  Critical care was necessary to treat or prevent imminent or life-threatening deterioration.  Critical care was time spent personally by me on the following activities: development of treatment plan with patient and/or surrogate as well as nursing, discussions with consultants, evaluation of patient's response to treatment, examination of patient, obtaining history from patient or surrogate, ordering and performing treatments and interventions, ordering and review of laboratory studies, ordering and review of radiographic studies, pulse oximetry and re-evaluation of patient's condition.   Medications Ordered in ED Medications  dextrose 5 %-0.45 % sodium chloride infusion (has no administration in time range)  insulin regular, human (MYXREDLIN) 100 units/ 100 mL infusion (has no administration in time range)  magnesium sulfate IVPB 2 g 50 mL (has no administration in time range)  sodium chloride 0.9 % bolus 2,000 mL (0 mLs Intravenous Stopped 05/26/18 1559)  metoCLOPramide (REGLAN) injection 10 mg (10 mg Intravenous Given 05/26/18 1443)  diphenhydrAMINE (BENADRYL) injection 25 mg (25 mg Intravenous Given 05/26/18 1444)  famotidine (PEPCID) IVPB 20 mg premix (0 mg Intravenous Stopped 05/26/18 1559)     Initial Impression / Assessment and Plan / ED Course  I have reviewed the triage vital signs and the nursing notes.  Pertinent labs & imaging results that were available during my care of the patient were reviewed by me and considered in my medical decision making (see chart for details).     22 y.o. male here with anxiety issues, and nausea vomiting or diarrhea which she states is related to when he gets anxious.  His girlfriend broke up with him and he has been laying around in bed not eating or drinking very much.  He is a type I diabetic.   On exam, heart rate in the 120s, patient appears very dehydrated, lips are extremely dry, he has some moderate right upper quadrant tenderness with positive Murphy sign, but otherwise non-peritoneal.  Slight kussmaul respirations. Will check labs to evaluate for DKA, will get right upper quadrant ultrasound to check for gallbladder disease, will give fluids and Reglan and Benadryl since Zofran did not help in route.  Will reassess shortly.  4:08 PM CBC w/diff with mild leukocytosis WBC 15.6 which could be from dehydration/stress demargination. CMP with bicarb 11, gluc 413, anion gap 22 c/w DKA, will start glucostabilizer; also with mildly elevated TBili 1.7, could be from dehydration vs gallbladder pathology but  all other LFTs WNL; awaiting RUQ U/S still. Lipase WNL. Mg low at 1.5, will give IV Mg; Phos level WNL. VBG not yet done, advised nursing that this is very important to get STAT. U/A with 80 ketones, no evidence of UTI. Will start glucostabilizer for DKA, and admit. Discussed case with my attending Dr. Ethelda Chick who agrees with plan.   4:27 PM VBG and RUQ U/S pending. Dr. Ella Jubilee of [facility] returning page and will admit. Holding orders to be placed by admitting team. Please see their notes for further documentation of care. I appreciate their help with this pleasant pt's care. Pt stable at time of admission.    Final Clinical Impressions(s) / ED Diagnoses   Final diagnoses:  RUQ abdominal tenderness  Diabetic ketoacidosis without coma associated with type 1 diabetes mellitus (HCC)  Nausea vomiting and diarrhea  Anxiety  Dehydration  Hypomagnesemia    ED Discharge Orders    [address], [place], [place] 05/26/18 1627    Doug Sou, MD 05/26/18 [phone]

## 2018-05-26 NOTE — H&P (Signed)
History and Physical    Robert FamKody R Villa ZOX:[id number] DOB: 12/04/1995 DOA: 05/26/2018  PCP: Adrian PrinceSouth, Stephen, MD   Patient coming from: Home   Chief Complaint: nausea and vomiting.   HPI: Robert Villa is a 22 y.o. male with medical history significant of type 1 diabetes mellitus, controlled with subcutaneous insulin.  For the last 4 days patient stopped using consistently his insulin due to psychological stress, (lost her girlfriend).  Since then he has been experiencing aches and pains, polyuria, polydipsia, increased thirst, nausea and vomiting along with abdominal pain.  His symptoms were persistent and worsening, no improving factors, no worsening factors.  Associated with hyperglycemia.  His oral intake had decreased due to GI symptoms.  Patient usually is well controlled with subcutaneous insulin, many years ago he went into ketoacidosis after a change in his insulin regimen.  ED Course: Patient was found hyperglycemic in diabetes ketoacidosis, received IV fluids and started on insulin infusion.  Review of Systems:  1. General: No fevers, no chills, no weight gain or weight loss/ generalized malaise, aches and pains.  2. ENT: No runny nose or sore throat, no hearing disturbances 3. Pulmonary: No dyspnea, cough, wheezing, or hemoptysis 4. Cardiovascular: No angina, claudication, lower extremity edema, pnd or orthopnea 5. Gastrointestinal: positive nausea and vomiting, with abdominal pain as mentioned in HPI, but no diarrhea or constipation 6. Hematology: No easy bruisability or frequent infections 7. Urology: No dysuria, hematuria or increased urinary frequency 8. Dermatology: No rashes. 9. Neurology: No seizures or paresthesias 10. Musculoskeletal: No joint pain or deformities  Past Medical History:  Diagnosis Date  . Diabetes mellitus   . Essential hypertension   . Mental disorder     Past Surgical History:  Procedure Laterality Date  . ANKLE SURGERY    . NOSE SURGERY       reports that he has been smoking cigarettes and e-cigarettes. He has been smoking about 0.50 packs per day. He has never used smokeless tobacco. He reports that he has current or past drug history. Drug: Marijuana. He reports that he does not drink alcohol.  Allergies  Allergen Reactions  . Claritin [Loratadine]     Nose bleeds  . Zoloft [Sertraline Hcl]     Heavy Sweats & Jittery    History reviewed. No pertinent family history.   Prior to Admission medications   Medication Sig Start Date End Date Taking? Authorizing Provider  ALPRAZolam Prudy Feeler(XANAX) 1 MG tablet Take 0.5 mg by mouth 2 (two) times daily as needed for anxiety.  05/13/18  Yes [provider]  buPROPion (WELLBUTRIN XL) 300 MG 24 hr tablet Take 300 mg by mouth daily.  05/13/18  Yes [provider]  ibuprofen (ADVIL,MOTRIN) 200 MG tablet Take 200 mg by mouth every 6 (six) hours as needed.   Yes [provider]  insulin aspart (NOVOLOG) 100 UNIT/ML injection Inject 10 Units into the skin daily with lunch.    Yes [provider]  insulin NPH-regular Human (70-30) 100 UNIT/ML injection Inject 20 Units into the skin 2 (two) times daily with a meal.   Yes [provider]  lisinopril (PRINIVIL,ZESTRIL) 10 MG tablet Take 10 mg by mouth at bedtime.   Yes [provider]  BD INSULIN SYRINGE U/F 31G X 5/16" 0.3 ML MISC AS DIRECTED FOUR TIMES A DAY WITH INSULIN DX E10.9 90 DAYS 05/13/18   [provider]    Physical Exam: Vitals:   05/26/18 1444 05/26/18 1500 05/26/18 1530 05/26/18 1600  BP: (!) 143/86 137/78 124/68 124/68  Pulse: (!) 128 (!) 126 (!) 123 (!) 123  Resp: 18   18  Temp:      SpO2: 99% 98% 96% 96%  Weight:      Height:        Vitals:   05/26/18 1444 05/26/18 1500 05/26/18 1530 05/26/18 1600  BP: (!) 143/86 137/78 124/68 124/68  Pulse: (!) 128 (!) 126 (!) 123 (!) 123  Resp: 18   18  Temp:      SpO2: 99% 98% 96% 96%  Weight:      Height:       General:  deconditioned and ill looking appearing Neurology: Awake and alert, non focal Head and Neck. Head normocephalic. Neck supple with no adenopathy or thyromegaly.   E ENT: mild pallor, no icterus, oral mucosa dry.  Cardiovascular: No JVD. S1-S2 present, rhythmic, no gallops, rubs, or murmurs. No lower extremity edema. Pulmonary: decreased breath sounds bilaterally, adequate air movement, no wheezing, rhonchi or rales. Gastrointestinal. Abdomen with, no organomegaly, non tender, no rebound or guarding Skin. No rashes Musculoskeletal: no joint deformities    Labs on Admission: I have personally reviewed following labs and imaging studies  CBC: Recent Labs  Lab 05/26/18 1431  WBC 15.6*  NEUTROABS 13.2*  HGB 15.7  HCT 46.7  MCV 89.1  PLT 305   Basic Metabolic Panel: Recent Labs  Lab 05/26/18 1431  NA 134*  K 4.9  CL 101  CO2 11*  GLUCOSE 413*  BUN 13  CREATININE 1.09  CALCIUM 8.4*  MG 1.5*  PHOS 3.8   GFR: Estimated Creatinine Clearance: 97.7 mL/min (by C-G formula based on SCr of 1.09 mg/dL). Liver Function Tests: Recent Labs  Lab 05/26/18 1431  AST 16  ALT 15  ALKPHOS 85  BILITOT 1.7*  PROT 7.3  ALBUMIN 4.5   Recent Labs  Lab 05/26/18 1431  LIPASE 18   No results for input(s): AMMONIA in the last 168 hours. Coagulation Profile: No results for input(s): INR, PROTIME in the last 168 hours. Cardiac Enzymes: No results for input(s): CKTOTAL, CKMB, CKMBINDEX, TROPONINI in the last 168 hours. BNP (last 3 results) No results for input(s): PROBNP in the last 8760 hours. HbA1C: No results for input(s): HGBA1C in the last 72 hours. CBG: Recent Labs  Lab 05/26/18 1334  GLUCAP 347*   Lipid Profile: No results for input(s): CHOL, HDL, LDLCALC, TRIG, CHOLHDL, LDLDIRECT in the last 72 hours. Thyroid Function Tests: No results for input(s): TSH, T4TOTAL, FREET4, T3FREE, THYROIDAB in the last 72 hours. Anemia Panel: No results for input(s): VITAMINB12, FOLATE,  FERRITIN, TIBC, IRON, RETICCTPCT in the last 72 hours. Urine analysis:    Component Value Date/Time   COLORURINE STRAW (A) 05/26/2018 1503   APPEARANCEUR CLEAR 05/26/2018 1503   LABSPEC 1.023 05/26/2018 1503   PHURINE 5.0 05/26/2018 1503   GLUCOSEU >=500 (A) 05/26/2018 1503   HGBUR SMALL (A) 05/26/2018 1503   BILIRUBINUR NEGATIVE 05/26/2018 1503   KETONESUR 80 (A) 05/26/2018 1503   PROTEINUR NEGATIVE 05/26/2018 1503   UROBILINOGEN 0.2 11/13/2011 1303   NITRITE NEGATIVE 05/26/2018 1503   LEUKOCYTESUR NEGATIVE 05/26/2018 1503    Radiological Exams on Admission: No results found.  EKG: Independently reviewed. NA  Assessment/Plan Active Problems:   DKA (diabetic ketoacidoses) (HCC)  22 year old male who presented with 4 days of worsening nausea, vomiting, generalized weakness and abdominal pain.  Has been inconsistent with the use of his insulin therapy.  On his initial physical  examination his blood pressure 137/78, heart rate 126, respiratory rate 18, oxygen saturation 96%, dry mucous membranes, lungs clear to auscultation bilaterally, heart S1-S2 present tachycardic, abdomen soft nontender, no lower extremity edema.  Sodium 134, potassium 4.9, chloride 101, bicarb 11, glucose 413, BUN 13, creatinine 1.0, anion gap 22, magnesium 1.5, phosphorus 3.8, AST 16, ALT 15, white count 15.6, hemoglobin 15.7, hematocrit 46.7, platelets 305, urinalysis with greater than 500 glucose.  Abdominal ultrasound pending.  Patient will be admitted to the hospital with a working diagnosis of diabetes ketoacidosis.  1.  Diabetes ketoacidosis related to poor insulin compliance.  Patient will be admitted to the stepdown unit, continue IV fluids with isotonic saline, potassium repletion once potassium is consistently below 5, follow-up chemistry every 4 hours for the first 24 hours, replete magnesium with magnesium sulfate.  Continue insulin infusion intravenously until anion gap closes, then will plan to  advance diet and transition to subcutaneous insulin.  Change fluids with dextrose once glucose less than 250 mg/dL. For now we will keep patient nothing by mouth.  As needed IV antiemetics.  2.  Type 1 diabetes mellitus.  Continue insulin therapy intravenously, then will transition to subcutaneous regimen, at home patient on 70/30, 20 units twice daily and 10 units of insulin aspart before meals.  3.  Depression/anxiety.  Continue alprazolam, and Wellbutrin.  4.  Diabetic nephropathy.  Will hold on lisinopril to avoid hypotension or worsening kidney injury.    DVT prophylaxis: enoxaparin  Code Status:  full  Family Communication: I spoke with patient's mother at the bedside and all questions were addressed.   Disposition Plan: Stepdown unit   Consults called: none   Admission status: Observation.     Janus Vlcek Annett Gula MD [facility] Pager [phone]  If 7PM-7AM, please contact night-coverage [email or web address] Password [facility]  05/26/2018, 4:32 PM

## 2018-05-26 NOTE — ED Provider Notes (Signed)
Patient with vomiting and diarrhea onset 4 days ago.  Feels dehydrated and thirsty.  On exam alert Glasgow Coma Score 15 nontoxic-appearing noted to be tachycardic. Labwork consistent with diabetic ketoacidosis.   Doug SouJacubowitz, Rhyan Radler, MD 05/26/18 1623

## 2018-05-26 NOTE — ED Triage Notes (Signed)
EMS reports from home, NVD, and increasing anxiety since girlfriend brooke up with him Wednesday. Hx of Anxiety and Diabetes.  BP 162/101 HR 120 RR 20 Sp02 98 RA CBG 311  20ga LAC 600ml NS enroute 4mg  Zofran

## 2018-05-27 ENCOUNTER — Other Ambulatory Visit: Payer: Self-pay

## 2018-05-27 DIAGNOSIS — E1010 Type 1 diabetes mellitus with ketoacidosis without coma: Secondary | ICD-10-CM | POA: Diagnosis not present

## 2018-05-27 LAB — BASIC METABOLIC PANEL
ANION GAP: 13 (ref 5–15)
Anion gap: 8 (ref 5–15)
Anion gap: 11 (ref 5–15)
BUN: 8 mg/dL (ref 6–20)
BUN: 7 mg/dL (ref 6–20)
BUN: 6 mg/dL (ref 6–20)
CALCIUM: 7.8 mg/dL — AB (ref 8.9–10.3)
CO2: 11 mmol/L — ABNORMAL LOW (ref 22–32)
CO2: 16 mmol/L — ABNORMAL LOW (ref 22–32)
CO2: 16 mmol/L — ABNORMAL LOW (ref 22–32)
Calcium: 7.9 mg/dL — ABNORMAL LOW (ref 8.9–10.3)
Calcium: 8.1 mg/dL — ABNORMAL LOW (ref 8.9–10.3)
Chloride: 109 mmol/L (ref 98–111)
Chloride: 111 mmol/L (ref 98–111)
Chloride: 109 mmol/L (ref 98–111)
Creatinine, Ser: 1.07 mg/dL (ref 0.61–1.24)
Creatinine, Ser: 0.88 mg/dL (ref 0.61–1.24)
Creatinine, Ser: 0.93 mg/dL (ref 0.61–1.24)
GFR calc Af Amer: >60 mL/min (ref >60)
GFR calc Af Amer: >60 mL/min (ref >60)
GFR calc Af Amer: >60 mL/min (ref >60)
GFR calc non Af Amer: >60 mL/min (ref >60)
GFR calc non Af Amer: >60 mL/min (ref >60)
GFR calc non Af Amer: >60 mL/min (ref >60)
GLUCOSE: 148 mg/dL — AB (ref 70–99)
Glucose, Bld: 247 mg/dL — ABNORMAL HIGH (ref 70–99)
Glucose, Bld: 200 mg/dL — ABNORMAL HIGH (ref 70–99)
Potassium: 4.2 mmol/L (ref 3.5–5.1)
Potassium: 3.6 mmol/L (ref 3.5–5.1)
Potassium: 3.5 mmol/L (ref 3.5–5.1)
Sodium: 133 mmol/L — ABNORMAL LOW (ref 135–145)
Sodium: 135 mmol/L (ref 135–145)
Sodium: 136 mmol/L (ref 135–145)

## 2018-05-27 LAB — GLUCOSE, CAPILLARY
GLUCOSE-CAPILLARY: 178 mg/dL — AB (ref 70–99)
Glucose-Capillary: 100 mg/dL — ABNORMAL HIGH (ref 70–99)
Glucose-Capillary: 100 mg/dL — ABNORMAL HIGH (ref 70–99)
Glucose-Capillary: 135 mg/dL — ABNORMAL HIGH (ref 70–99)
Glucose-Capillary: 138 mg/dL — ABNORMAL HIGH (ref 70–99)
Glucose-Capillary: 189 mg/dL — ABNORMAL HIGH (ref 70–99)
Glucose-Capillary: 191 mg/dL — ABNORMAL HIGH (ref 70–99)
Glucose-Capillary: 194 mg/dL — ABNORMAL HIGH (ref 70–99)
Glucose-Capillary: 209 mg/dL — ABNORMAL HIGH (ref 70–99)
Glucose-Capillary: 219 mg/dL — ABNORMAL HIGH (ref 70–99)
Glucose-Capillary: 269 mg/dL — ABNORMAL HIGH (ref 70–99)
Glucose-Capillary: 272 mg/dL — ABNORMAL HIGH (ref 70–99)

## 2018-05-27 LAB — HIV ANTIBODY (ROUTINE TESTING W REFLEX): HIV Screen 4th Generation wRfx: NONREACTIVE

## 2018-05-27 MED ORDER — INSULIN ASPART 100 UNIT/ML ~~LOC~~ SOLN
0.0000 [IU] | Freq: Three times a day (TID) | SUBCUTANEOUS | Status: DC
  Administered 2018-05-27: 3 [IU] via SUBCUTANEOUS

## 2018-05-27 MED ORDER — INSULIN ASPART 100 UNIT/ML ~~LOC~~ SOLN: 0.0000 [IU] | Freq: Every day | SUBCUTANEOUS | Status: DC

## 2018-05-27 MED ORDER — ONDANSETRON HCL 4 MG PO TABS: 4.0000 mg | ORAL_TABLET | Freq: Four times a day (QID) | ORAL | 0 refills | Status: DC | PRN

## 2018-05-27 MED ORDER — INSULIN DETEMIR 100 UNIT/ML ~~LOC~~ SOLN
13.0000 [IU] | SUBCUTANEOUS | Status: DC
  Administered 2018-05-27: 13 [IU] via SUBCUTANEOUS
  Filled 2018-05-27: qty 0.13

## 2018-05-27 NOTE — Discharge Summary (Signed)
Physician Discharge Summary  Robert Villa FAO:[id number] DOB: 06/19/1996 DOA: 05/26/2018  PCP: Adrian Prince, MD  Admit date: 05/26/2018 Discharge date: 05/27/2018  Admitted From: Home Disposition: Home  Recommendations for Outpatient Follow-up:  1. Follow up with PCP in 1week 2. Comply with medications and follow-up   Home Health: No Equipment/Devices: None Discharge Condition: Stable CODE STATUS: Full Diet recommendation: Heart Healthy / Carb Modified    Brief/Interim Summary: 22 year old male with history of diabetes mellitus type 1 presented with nausea and vomiting.  He was found to be in DKA and started on IV fluids and insulin.  During the hospitalization, his condition improved, his anion gap closed.  He was transitioned to long-acting insulin.  He has tolerated diet.  He wants to go home.  He will be discharged home to continue his home regimen.  Outpatient follow-up with PCP.  Discharge Diagnoses:  Active Problems:   DKA (diabetic ketoacidoses) (HCC)  DKA in a patient with history of diabetes mellitus type 1 -probably secondary to medical noncompliance. -Started on insulin drip and IV fluids.  Anion gap has closed.  Patient has been transitioned to long-acting subcutaneous insulin and insulin drip has been discontinued.  Patient has tolerated his diet.  He wants to go home.  He will be discharged home on his current home insulin regimen.  Counseled about compliance to his insulin regimen.  Outpatient follow-up with his PCP  Depression/anxiety -Continue Wellbutrin and alprazolam  Diabetic nephropathy -continue lisinopril  Discharge Instructions  Discharge Instructions    Call MD for:  persistant nausea and vomiting   Complete by:  As directed    Diet - low sodium heart healthy   Complete by:  As directed    Diet Carb Modified   Complete by:  As directed    Increase activity slowly   Complete by:  As directed      Allergies as of 05/27/2018      Reactions    Claritin [loratadine]    Nose bleeds   Zoloft [sertraline Hcl]    Heavy Sweats & Jittery      Medication List    TAKE these medications   ALPRAZolam 1 MG tablet Commonly known as:  XANAX Take 0.5 mg by mouth 2 (two) times daily as needed for anxiety.   BD INSULIN SYRINGE U/F 31G X 5/16" 0.3 ML Misc Generic drug:  Insulin Syringe-Needle U-100 AS DIRECTED FOUR TIMES A DAY WITH INSULIN DX E10.9 90 DAYS   buPROPion 300 MG 24 hr tablet Commonly known as:  WELLBUTRIN XL Take 300 mg by mouth daily.   ibuprofen 200 MG tablet Commonly known as:  ADVIL,MOTRIN Take 200 mg by mouth every 6 (six) hours as needed.   insulin aspart 100 UNIT/ML injection Commonly known as:  novoLOG Inject 10 Units into the skin daily with lunch.   insulin NPH-regular Human (70-30) 100 UNIT/ML injection Inject 20 Units into the skin 2 (two) times daily with a meal.   lisinopril 10 MG tablet Commonly known as:  PRINIVIL,ZESTRIL Take 10 mg by mouth at bedtime.   ondansetron 4 MG tablet Commonly known as:  ZOFRAN Take 1 tablet (4 mg total) by mouth every 6 (six) hours as needed for nausea.      Follow-up Information    Adrian Prince, MD. Schedule an appointment as soon as possible for a visit in 1 week(s).   Specialty:  Endocrinology Contact information: [address] [place] [address] [phone]  Allergies  Allergen Reactions  . Claritin [Loratadine]     Nose bleeds  . Zoloft [Sertraline Hcl]     Heavy Sweats & Jittery    Consultations:  None   Procedures/Studies: US Abdomen Limited Ruq  Result Date: 05/26/2018 CLINICAL DATA:  Right upper quadrant tenderness EXAM: ULTRASOUND ABDOMEN LIMITED RIGHT UPPER QUADRANT COMPARISON:  None. FINDINGS: Gallbladder: No gallstones or wall thickening visualized. No sonographic Murphy sign noted by sonographer. Common bile duct: Diameter: 2.2 mm Liver: No focal lesion identified. Within normal limits in parenchymal echogenicity.  Portal vein is patent on color Doppler imaging with normal direction of blood flow towards the liver. IMPRESSION: No cholelithiasis or sonographic evidence of acute cholecystitis. Electronically Signed   By: Elige Ko   On: 05/26/2018 17:41    Subjective: Patient seen and examined at bedside.  He is very eager to go home.  Currently denies any abdominal pain, fever or vomiting.  Discharge Exam: Vitals:   05/27/18 1000 05/27/18 1100  BP: 116/83 118/77  Pulse: (!) 112 98  Resp: 13 (!) 22  Temp:    SpO2: 99% 100%   Vitals:   05/27/18 0800 05/27/18 0900 05/27/18 1000 05/27/18 1100  BP: 127/84 (!) 106/91 116/83 118/77  Pulse: 92 (!) 103 (!) 112 98  Resp: (!) 26 18 13  (!) 22  Temp:      TempSrc:      SpO2: 98% 98% 99% 100%  Weight:      Height:        General: Pt is alert, awake, not in acute distress Cardiovascular: rate controlled, S1/S2 + Respiratory: bilateral decreased breath sounds at bases Abdominal: Soft, NT, ND, bowel sounds + Extremities: no edema, no cyanosis    The results of significant diagnostics from this hospitalization (including imaging, microbiology, ancillary and laboratory) are listed below for reference.     Microbiology: Recent Results (from the past 240 hour(s))  MRSA PCR Screening     Status: None   Collection Time: 05/26/18  9:36 PM  Result Value Ref Range Status   MRSA by PCR NEGATIVE NEGATIVE Final    Comment:        The GeneXpert MRSA Assay (FDA approved for NASAL specimens only), is one component of a comprehensive MRSA colonization surveillance program. It is not intended to diagnose MRSA infection nor to guide or monitor treatment for MRSA infections. Performed at [facility], [address], [place], [place] [address]      Labs: BNP (last 3 results) No results for input(s): BNP in the last 8760 hours. Basic Metabolic Panel: Recent Labs  Lab 05/26/18 1431 05/26/18 2300 05/27/18 0342 05/27/18 0701  05/27/18 1049  NA 134* 136 133* 135 136  K 4.9 4.3 4.2 3.6 3.5  CL 101 110 109 111 109  CO2 11* 11* 11* 16* 16*  GLUCOSE 413* 144* 247* 148* 200*  BUN 13 9 8 7 6   CREATININE 1.09 1.09 1.07 0.88 0.93  CALCIUM 8.4* 7.9* 7.8* 7.9* 8.1*  MG 1.5* 2.1  --   --   --   PHOS 3.8  --   --   --   --    Liver Function Tests: Recent Labs  Lab 05/26/18 1431  AST 16  ALT 15  ALKPHOS 85  BILITOT 1.7*  PROT 7.3  ALBUMIN 4.5   Recent Labs  Lab 05/26/18 1431  LIPASE 18   No results for input(s): AMMONIA in the last 168 hours. CBC: Recent Labs  Lab  05/26/18 1431 05/26/18 2300  WBC 15.6* 19.6*  NEUTROABS 13.2*  --   HGB 15.7 15.0  HCT 46.7 45.5  MCV 89.1 91.0  PLT 305 261   Cardiac Enzymes: No results for input(s): CKTOTAL, CKMB, CKMBINDEX, TROPONINI in the last 168 hours. BNP: Invalid input(s): POCBNP CBG: Recent Labs  Lab 05/27/18 0704 05/27/18 0811 05/27/18 0907 05/27/18 1006 05/27/18 1127  GLUCAP 138* 100* 100* 194* 178*   D-Dimer No results for input(s): DDIMER in the last 72 hours. Hgb A1c No results for input(s): HGBA1C in the last 72 hours. Lipid Profile No results for input(s): CHOL, HDL, LDLCALC, TRIG, CHOLHDL, LDLDIRECT in the last 72 hours. Thyroid function studies No results for input(s): TSH, T4TOTAL, T3FREE, THYROIDAB in the last 72 hours.  Invalid input(s): FREET3 Anemia work up No results for input(s): VITAMINB12, FOLATE, FERRITIN, TIBC, IRON, RETICCTPCT in the last 72 hours. Urinalysis    Component Value Date/Time   COLORURINE STRAW (A) 05/26/2018 1503   APPEARANCEUR CLEAR 05/26/2018 1503   LABSPEC 1.023 05/26/2018 1503   PHURINE 5.0 05/26/2018 1503   GLUCOSEU >=500 (A) 05/26/2018 1503   HGBUR SMALL (A) 05/26/2018 1503   BILIRUBINUR NEGATIVE 05/26/2018 1503   KETONESUR 80 (A) 05/26/2018 1503   PROTEINUR NEGATIVE 05/26/2018 1503   UROBILINOGEN 0.2 11/13/2011 1303   NITRITE NEGATIVE 05/26/2018 1503   LEUKOCYTESUR NEGATIVE 05/26/2018 1503    Sepsis Labs Invalid input(s): PROCALCITONIN,  WBC,  LACTICIDVEN Microbiology Recent Results (from the past 240 hour(s))  MRSA PCR Screening     Status: None   Collection Time: 05/26/18  9:36 PM  Result Value Ref Range Status   MRSA by PCR NEGATIVE NEGATIVE Final    Comment:        The GeneXpert MRSA Assay (FDA approved for NASAL specimens only), is one component of a comprehensive MRSA colonization surveillance program. It is not intended to diagnose MRSA infection nor to guide or monitor treatment for MRSA infections. Performed at [facility], [address] [place], [place] [id number]      Time coordinating discharge: 35 minutes  SIGNED:   Glade LloydKshitiz Veronia Laprise, MD  [facility] 05/27/2018, 11:31 AM Pager: [phone]  If 7PM-7AM, please contact night-coverage [email or web address] Password [email or web address]

## 2018-05-27 NOTE — Care Management Note (Signed)
Case Management Note  Patient Details  Name: Robert Villa MRN: [id number] Date of Birth: 06/28/1995  Subjective/Objective:                  discharged  Action/Plan: Discharged to home with self-care, orders checked for hhc needs. No CM needs present at time of discharge.  Patient is able to arrangement own appointments and home care.  Expected Discharge Date:  05/27/18               Expected Discharge Plan:  Home/Self Care  In-House Referral:     Discharge planning Services  CM Consult  Post Acute Care Choice:    Choice offered to:     DME Arranged:    DME Agency:     HH Arranged:    HH Agency:     Status of Service:  Completed, signed off  If discussed at [employer] Length of Stay Meetings, dates discussed:    Additional Comments:  Golda AcreDavis, Autumnrose Yore Lynn, RN 05/27/2018, 12:00 PM

## 2019-01-06 IMAGING — DX DG CHEST 1V PORT
1 series · 1 of 1 positions shown · non-contrast
Comparison: 11/13/2011

CLINICAL DATA: Pt c/o weakness, sob, chest discomfort, n/v, onset x
"awhile," worsened today. pt states his diabetes medicine changed
recently, notes fluctuating blood sugar levels.

EXAM:
PORTABLE CHEST 1 VIEW

[chest ap]
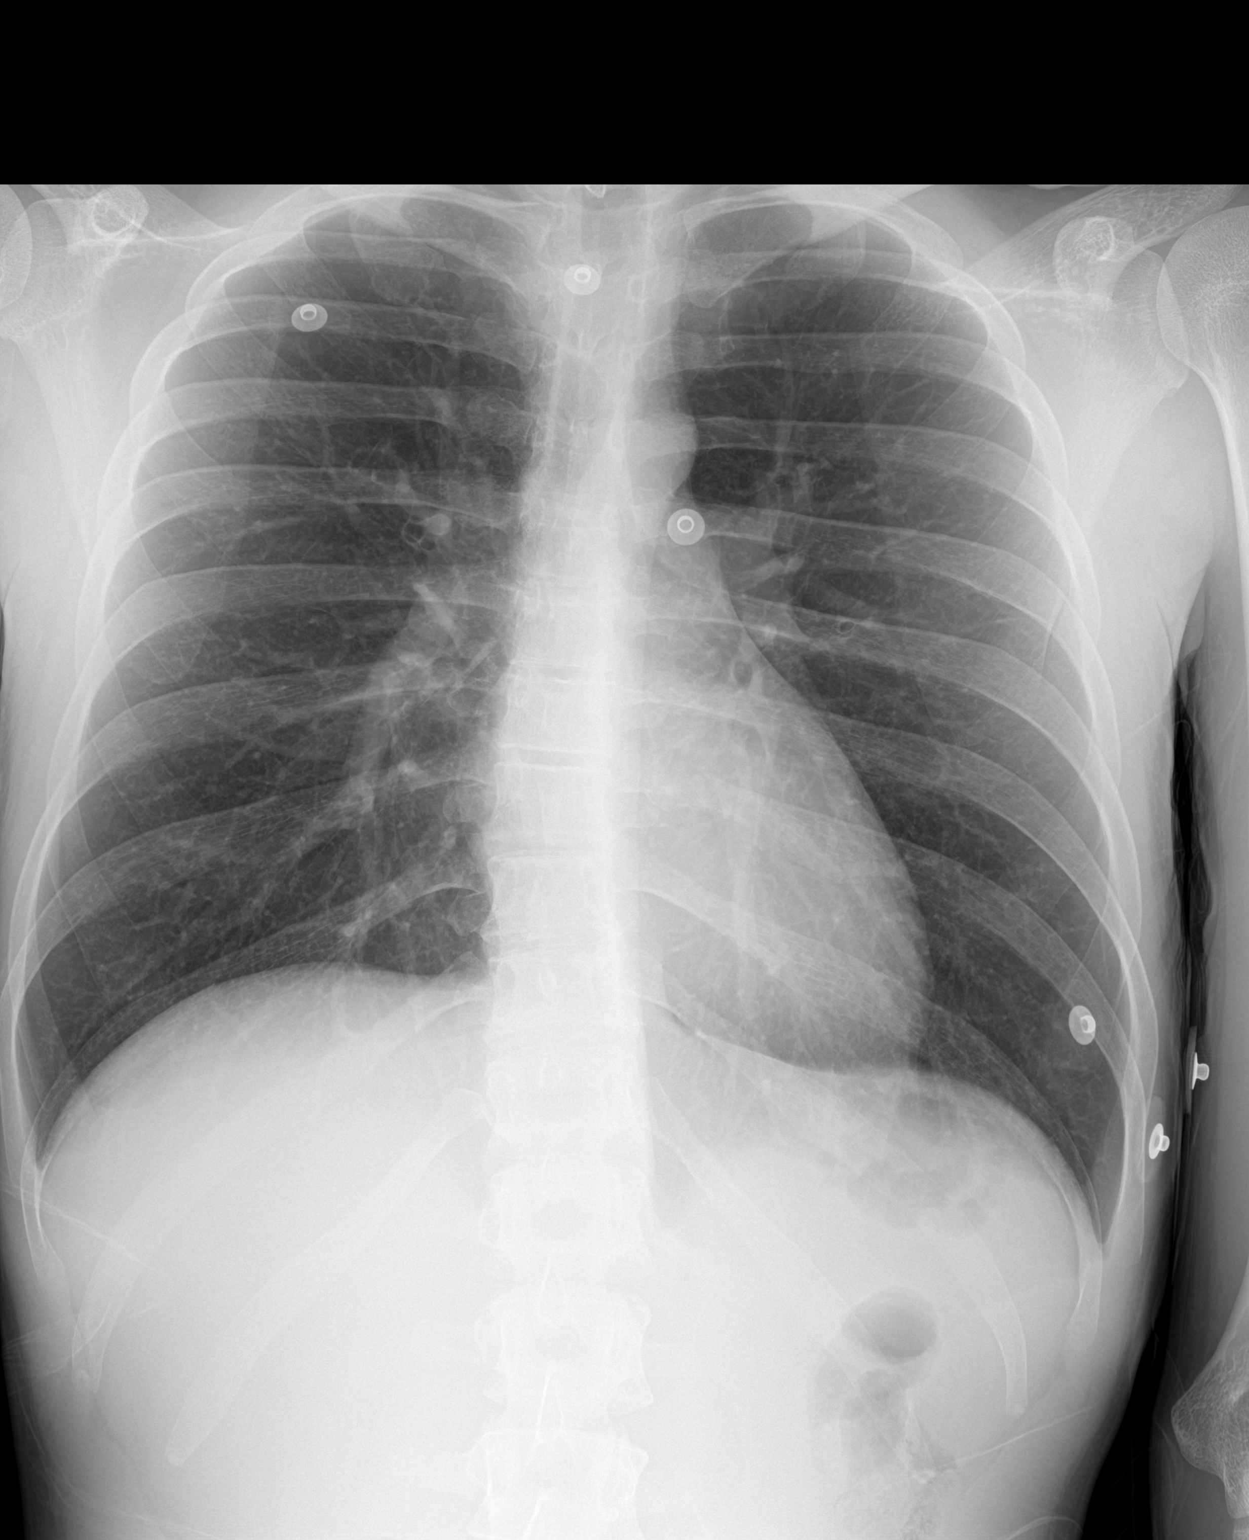

[1 of 1 positions shown; findings below may reference images not displayed]

FINDINGS: The heart size and mediastinal contours are within normal limits.
Both lungs are clear. No pleural effusion or pneumothorax. The
visualized skeletal structures are unremarkable.
IMPRESSION: Normal frontal chest radiograph.

## 2019-04-27 IMAGING — US US ABDOMEN LIMITED
1 series · 14 of 25 positions shown · non-contrast
Comparison: None.

CLINICAL DATA: Right upper quadrant tenderness

EXAM:
ULTRASOUND ABDOMEN LIMITED RIGHT UPPER QUADRANT

[Series 1: us abdomen limited · 0.15mm/px · 14 of 40 slices shown]
[im 1/40]
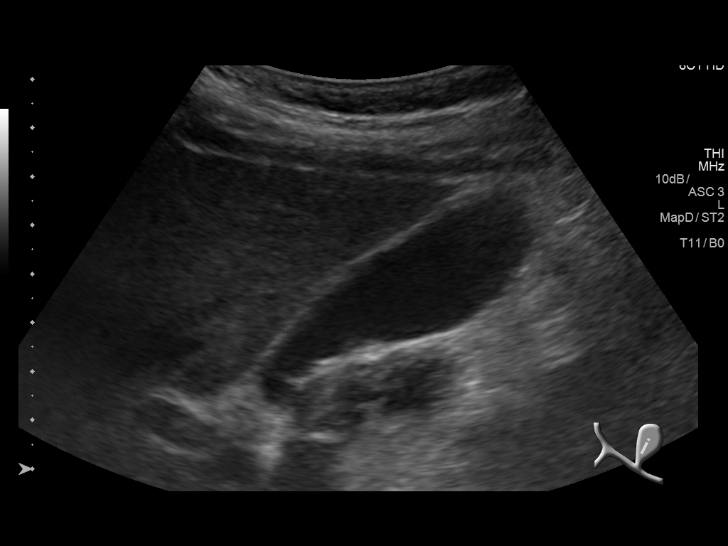
[im 4/40]
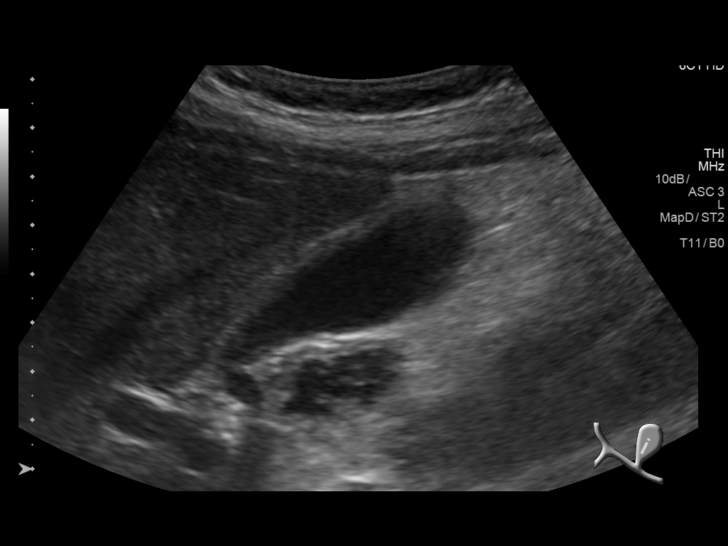
[im 7/40]
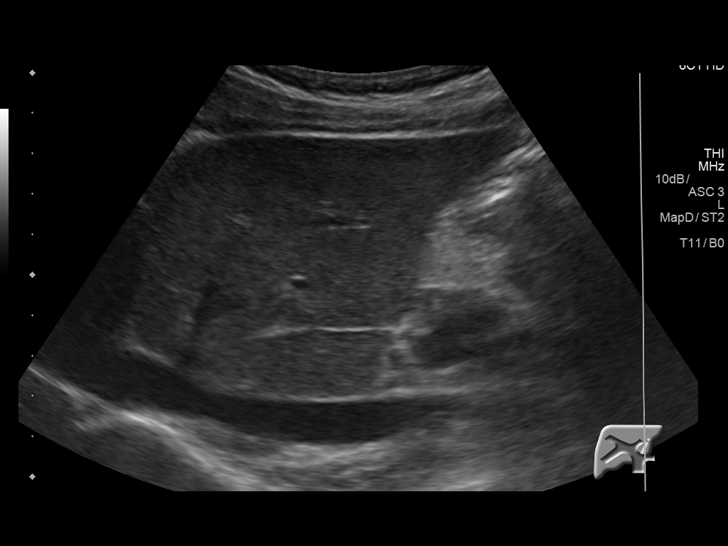
[im 10/40]
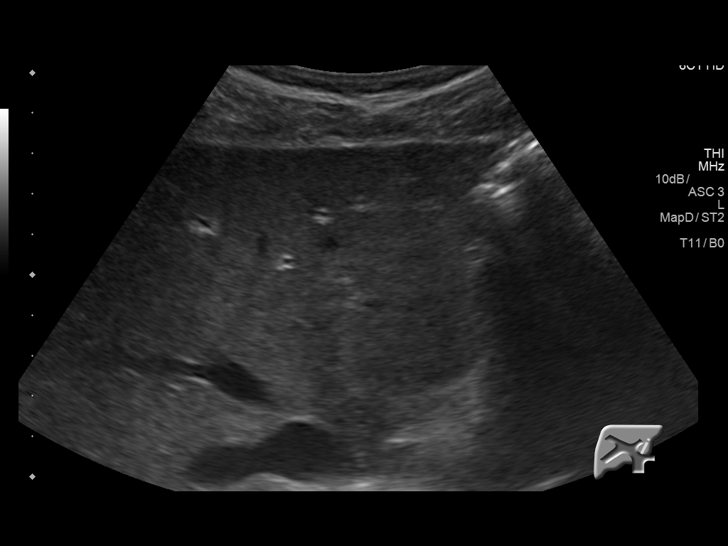
[im 14/40]
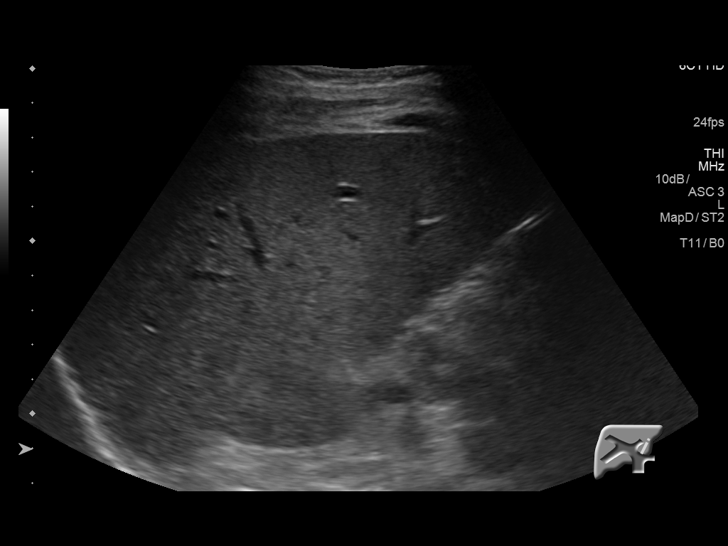
[im 15/40]
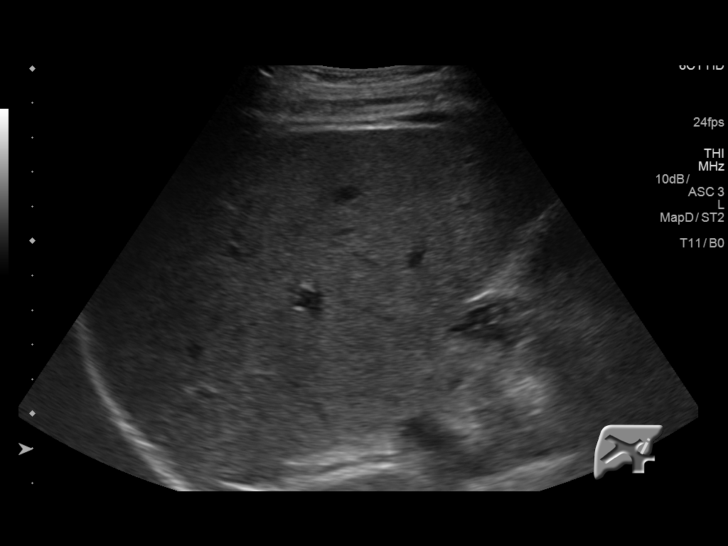
[im 18/40]
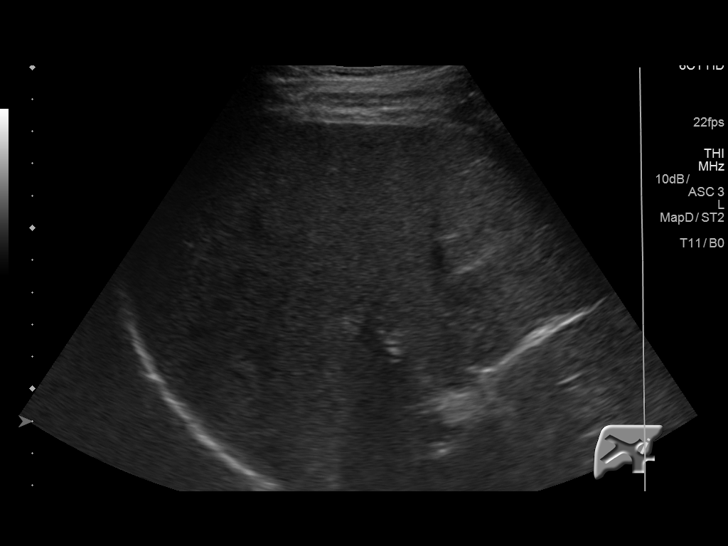
[im 22/40]
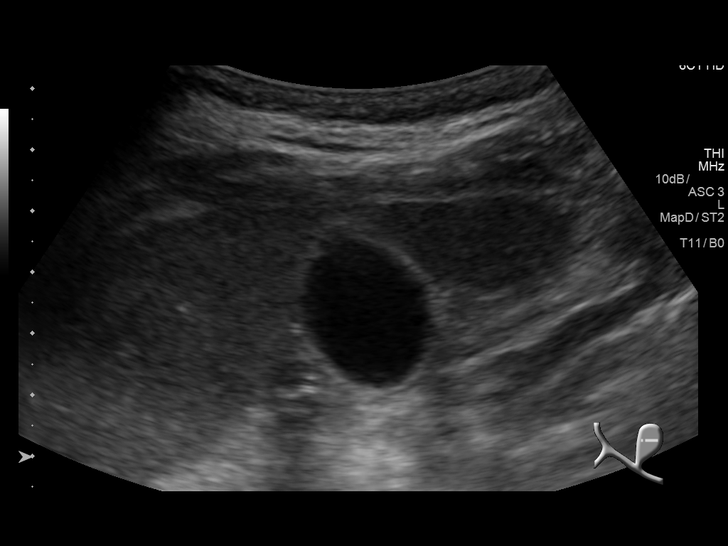
[im 25/40]
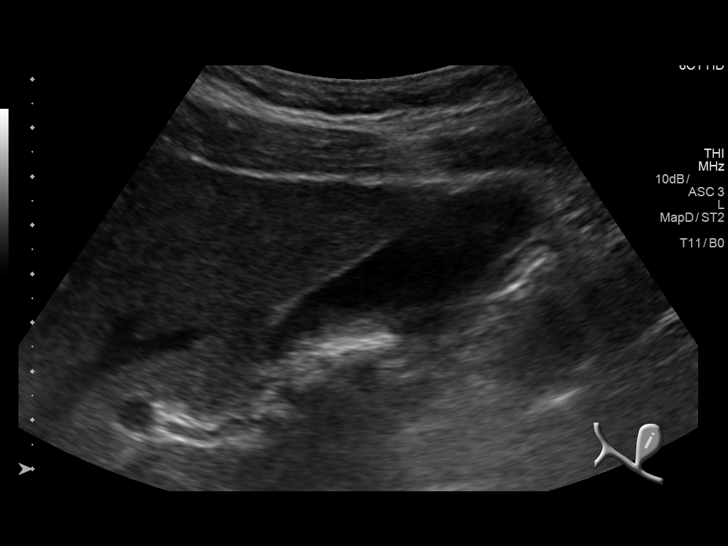
[im 27/40]
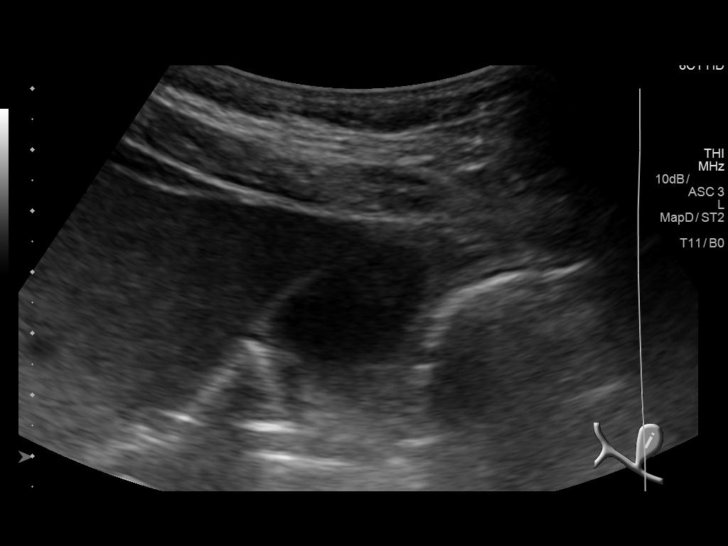
[im 30/40]
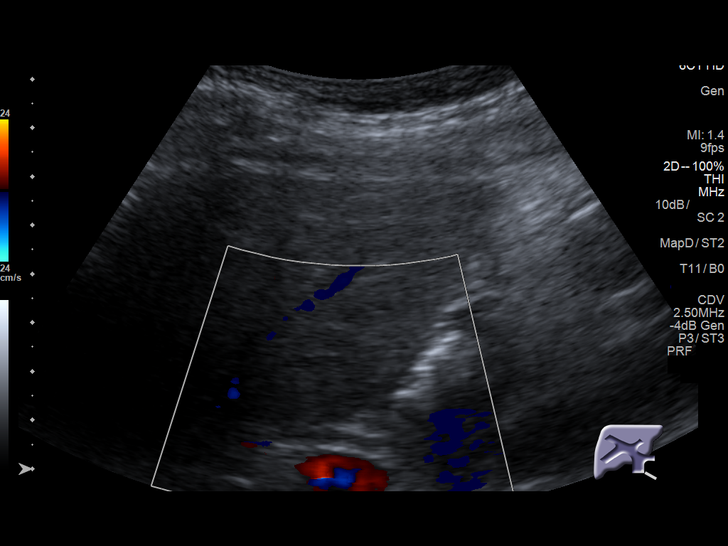
[im 33/40]
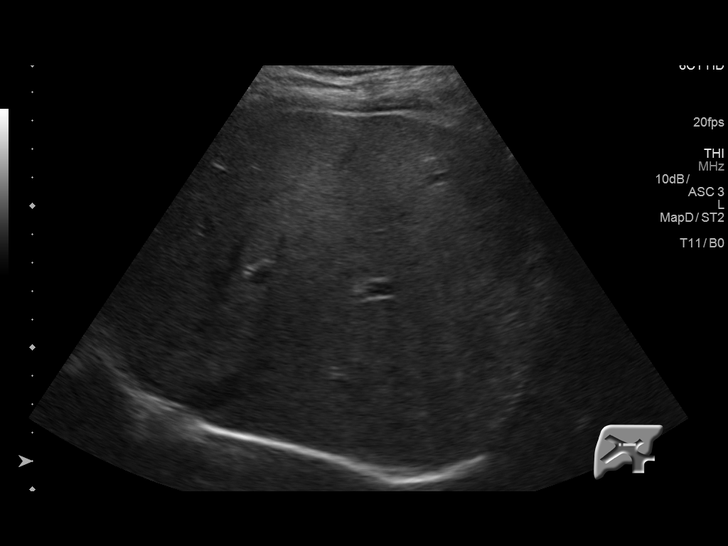
[im 36/40]
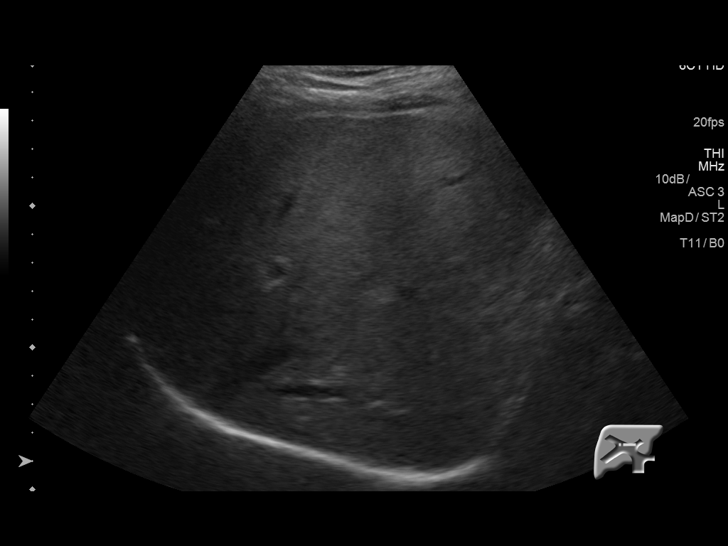
[im 40/40]
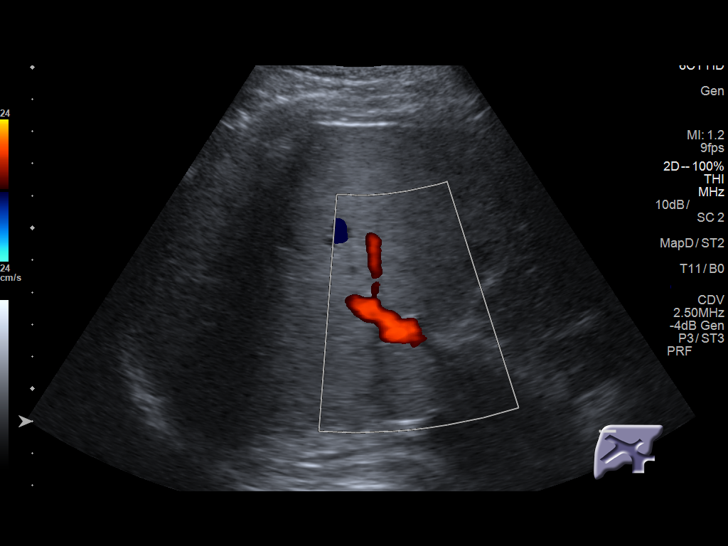

[14 of 25 positions shown; findings below may reference images not displayed]

FINDINGS: Gallbladder:

No gallstones or wall thickening visualized. No sonographic Murphy
sign noted by sonographer.

Common bile duct:

Diameter: 2.2 mm

Liver:

No focal lesion identified. Within normal limits in parenchymal
echogenicity. Portal vein is patent on color Doppler imaging with
normal direction of blood flow towards the liver.
IMPRESSION: No cholelithiasis or sonographic evidence of acute cholecystitis.

## 2019-08-01 ENCOUNTER — Encounter: Payer: Self-pay | Admitting: Physician Assistant

## 2019-08-01 ENCOUNTER — Ambulatory Visit (INDEPENDENT_AMBULATORY_CARE_PROVIDER_SITE_OTHER): Payer: 59 | Admitting: Physician Assistant

## 2019-08-01 ENCOUNTER — Other Ambulatory Visit: Payer: Self-pay

## 2019-08-01 VITALS — BP 124/88 | HR 115 | Temp 98.3°F | Resp 16 | Ht 66.0 in | Wt 161.0 lb

## 2019-08-01 DIAGNOSIS — I10 Essential (primary) hypertension: Secondary | ICD-10-CM

## 2019-08-01 DIAGNOSIS — E109 Type 1 diabetes mellitus without complications: Secondary | ICD-10-CM

## 2019-08-01 DIAGNOSIS — K219 Gastro-esophageal reflux disease without esophagitis: Secondary | ICD-10-CM

## 2019-08-01 MED ORDER — PANTOPRAZOLE SODIUM 20 MG PO TBEC: 20.0000 mg | DELAYED_RELEASE_TABLET | Freq: Every day | ORAL | 0 refills | Status: DC

## 2019-08-01 NOTE — Progress Notes (Signed)
Patient presents to clinic today to establish care. Patient is transferring care from [facility] in [place], [place].  Acute Concerns: Patient notes occasional issue with reflux worse with laying down. Is an almost daily occurrence. Notes some foods worsen this. Denies abdominal pain, nausea or vomiting.  Chronic Issues: DM I -- Prior history of non-compliance and multiple admissions. Diagnosed at age 24 in hospital. DM I has been managed by his previous PCP. Is currently on Novolog 70/30 taking 20 units twice daily. Endorses taking as directed. Does not routinely check glucose levels. Last A1C was about 1 year ago at around 10 per patient report. Did check with a home A1C test recently and it was around 8. Is trying to watch his diet and keep active. Denies any neuropathy, vision changes, polyuria, polydipsia or polyphagia. Was placed on lisinopril 10 mg by prior provider. Takes this medication daily as directed. Patient denies chest pain, palpitations, lightheadedness, dizziness, vision changes or frequent headaches. Glucose 144 when checked this morning fasting.   GAD -- Patient on long-standing regimen of Wellbutrin XL 300 mg once daily. Uses Alprazolam 0.5 mg as needed for more acute anxiety. Has not had to use in some time.   Health Maintenance: Immunizations -- Will obtain records.   Past Medical History:  Diagnosis Date  . Anxiety   . Diabetes mellitus   . Essential hypertension   . Mental disorder     Past Surgical History:  Procedure Laterality Date  . ANKLE SURGERY    . NOSE SURGERY      Current Outpatient Medications on File Prior to Visit  Medication Sig Dispense Refill  . ALPRAZolam (XANAX) 1 MG tablet Take 0.5 mg by mouth 2 (two) times daily as needed for anxiety.   2  . BD INSULIN SYRINGE U/F 31G X 5/16" 0.3 ML MISC AS DIRECTED FOUR TIMES A DAY WITH INSULIN DX E10.9 90 DAYS  3  . buPROPion (WELLBUTRIN XL) 300 MG 24 hr tablet Take 300 mg by mouth daily.   2  .  insulin aspart protamine- aspart (NOVOLOG MIX 70/30) (70-30) 100 UNIT/ML injection Inject into the skin. Injects 20 units twice a day Sliding scale with meals    . lisinopril (PRINIVIL,ZESTRIL) 10 MG tablet Take 10 mg by mouth at bedtime.     No current facility-administered medications on file prior to visit.    Allergies  Allergen Reactions  . Claritin [Loratadine]     Nose bleeds  . Zoloft [Sertraline Hcl]     Heavy Sweats & Jittery    Family History  Problem Relation Age of Onset  . Healthy Mother   . Cancer Father        Prostate  . Healthy Sister     Social History   Socioeconomic History  . Marital status: Single    Spouse name: Not on file  . Number of children: Not on file  . Years of education: Not on file  . Highest education level: Not on file  Occupational History  . Occupation: Scientist, water quality  Tobacco Use  . Smoking status: Current Some Day Smoker    Packs/day: 0.25    Types: Cigarettes, E-cigarettes  . Smokeless tobacco: Never Used  Substance and Sexual Activity  . Alcohol use: Yes  . Drug use: Not Currently    Types: Marijuana  . Sexual activity: Yes    Partners: Female    Birth control/protection: Condom  Other Topics Concern  . Not on file  Social  History Narrative  . Not on file   Social Determinants of Health   Financial Resource Strain:   . Difficulty of Paying Living Expenses: Not on file  Food Insecurity:   . Worried About Charity fundraiser in the Last Year: Not on file  . Ran Out of Food in the Last Year: Not on file  Transportation Needs:   . Lack of Transportation (Medical): Not on file  . Lack of Transportation (Non-Medical): Not on file  Physical Activity:   . Days of Exercise per Week: Not on file  . Minutes of Exercise per Session: Not on file  Stress:   . Feeling of Stress : Not on file  Social Connections:   . Frequency of Communication with Friends and Family: Not on file  . Frequency of Social Gatherings with Friends and  Family: Not on file  . Attends Religious Services: Not on file  . Active Member of Clubs or Organizations: Not on file  . Attends Archivist Meetings: Not on file  . Marital Status: Not on file  Intimate Partner Violence:   . Fear of Current or Ex-Partner: Not on file  . Emotionally Abused: Not on file  . Physically Abused: Not on file  . Sexually Abused: Not on file   Review of Systems  Constitutional: Negative for fever and weight loss.  HENT: Negative for ear discharge, ear pain, hearing loss and tinnitus.   Eyes: Negative for blurred vision, double vision, photophobia and pain.  Respiratory: Negative for cough and shortness of breath.   Cardiovascular: Negative for chest pain and palpitations.  Gastrointestinal: Positive for heartburn. Negative for abdominal pain, blood in stool, constipation, diarrhea, melena, nausea and vomiting.  Genitourinary: Negative for dysuria, flank pain, frequency, hematuria and urgency.  Musculoskeletal: Negative for falls.  Neurological: Negative for dizziness, loss of consciousness and headaches.  Endo/Heme/Allergies: Negative for environmental allergies.  Psychiatric/Behavioral: Negative for depression, hallucinations, substance abuse and suicidal ideas. The patient is not nervous/anxious and does not have insomnia.     BP 124/88   Pulse (!) 115   Temp 98.3 F (36.8 C) (Temporal)   Resp 16   Ht '5\' 6"'$  (1.676 m)   Wt 161 lb (73 kg)   SpO2 98%   BMI 25.99 kg/m   Physical Exam Vitals reviewed.  Constitutional:      Appearance: Normal appearance.  HENT:     Head: Normocephalic and atraumatic.     Right Ear: Tympanic membrane normal.     Left Ear: Tympanic membrane normal.  Eyes:     Conjunctiva/sclera: Conjunctivae normal.     Pupils: Pupils are equal, round, and reactive to light.  Cardiovascular:     Rate and Rhythm: Normal rate and regular rhythm.     Pulses: Normal pulses.     Heart sounds: Normal heart sounds.  Pulmonary:      Effort: Pulmonary effort is normal.     Breath sounds: Normal breath sounds.  Musculoskeletal:     Cervical back: Neck supple.  Neurological:     General: No focal deficit present.     Mental Status: He is alert and oriented to person, place, and time.  Psychiatric:        Mood and Affect: Mood normal.    Assessment/Plan: 1. Type 1 diabetes mellitus without complications (HCC) Previously uncontrolled. Patient to check glucose levels twice daily and record. Will update labs today so we can assess his current status and make further adjustments.  -  Comp Met (CMET) - Hemoglobin A1c - Lipid Profile  2. Essential hypertension Stable. Asymptomatic. Labs today. Continue ACEI. - Comp Met (CMET) - Hemoglobin A1c - Lipid Profile  3. Gastroesophageal reflux disease without esophagitis Start trial of GERD diet and Protonix. Avoid trigger foods and late-night eating. Follow-up discussed.  - pantoprazole (PROTONIX) 20 MG tablet; Take 1 tablet (20 mg total) by mouth daily.  Dispense: 30 tablet; Refill: 0  This visit occurred during the SARS-CoV-2 public health emergency.  Safety protocols were in place, including screening questions prior to the visit, additional usage of staff PPE, and extensive cleaning of exam room while observing appropriate contact time as indicated for disinfecting solutions.     Leeanne Rio, PA-C

## 2019-08-01 NOTE — Patient Instructions (Addendum)
Please go to the lab today for blood work.  I will call you with your results. We will alter treatment regimen(s) if indicated by your results.   I want you to follow the dietary recommendations below.  Take the Protonix daily as directed over the next 2 weeks. Let me know how this is working for you.    Food Choices for Gastroesophageal Reflux Disease, Adult When you have gastroesophageal reflux disease (GERD), the foods you eat and your eating habits are very important. Choosing the right foods can help ease your discomfort. Think about working with a nutrition specialist (dietitian) to help you make good choices. What are tips for following this plan?  Meals  Choose healthy foods that are low in fat, such as fruits, vegetables, whole grains, low-fat dairy products, and lean meat, fish, and poultry.  Eat small meals often instead of 3 large meals a day. Eat your meals slowly, and in a place where you are relaxed. Avoid bending over or lying down until 2-3 hours after eating.  Avoid eating meals 2-3 hours before bed.  Avoid drinking a lot of liquid with meals.  Cook foods using methods other than frying. Bake, grill, or broil food instead.  Avoid or limit: ? Chocolate. ? Peppermint or spearmint. ? Alcohol. ? Pepper. ? Black and decaffeinated coffee. ? Black and decaffeinated tea. ? Bubbly (carbonated) soft drinks. ? Caffeinated energy drinks and soft drinks.  Limit high-fat foods such as: ? Fatty meat or fried foods. ? Whole milk, cream, butter, or ice cream. ? Nuts and nut butters. ? Pastries, donuts, and sweets made with butter or shortening.  Avoid foods that cause symptoms. These foods may be different for everyone. Common foods that cause symptoms include: ? Tomatoes. ? Oranges, lemons, and limes. ? Peppers. ? Spicy food. ? Onions and garlic. ? Vinegar. Lifestyle  Maintain a healthy weight. Ask your doctor what weight is healthy for you. If you need to lose  weight, work with your doctor to do so safely.  Exercise for at least 30 minutes for 5 or more days each week, or as told by your doctor.  Wear loose-fitting clothes.  Do not smoke. If you need help quitting, ask your doctor.  Sleep with the head of your bed higher than your feet. Use a wedge under the mattress or blocks under the bed frame to raise the head of the bed. Summary  When you have gastroesophageal reflux disease (GERD), food and lifestyle choices are very important in easing your symptoms.  Eat small meals often instead of 3 large meals a day. Eat your meals slowly, and in a place where you are relaxed.  Limit high-fat foods such as fatty meat or fried foods.  Avoid bending over or lying down until 2-3 hours after eating.  Avoid peppermint and spearmint, caffeine, alcohol, and chocolate. This information is not intended to replace advice given to you by your health care provider. Make sure you discuss any questions you have with your health care provider. Document Revised: 09/26/2018 Document Reviewed: 07/11/2016 Elsevier Patient Education  2020 [employer].

## 2019-08-02 LAB — COMPREHENSIVE METABOLIC PANEL
AG Ratio: 1.6 (calc) (ref 1.0–2.5)
ALT: 10 U/L (ref 9–46)
AST: 13 U/L (ref 10–40)
Albumin: 4.6 g/dL (ref 3.6–5.1)
Alkaline phosphatase (APISO): 96 U/L (ref 36–130)
BUN: 12 mg/dL (ref 7–25)
CO2: 25 mmol/L (ref 20–32)
Calcium: 9.3 mg/dL (ref 8.6–10.3)
Chloride: 98 mmol/L (ref 98–110)
Creat: 0.93 mg/dL (ref 0.60–1.35)
Globulin: 2.9 g/dL (calc) (ref 1.9–3.7)
Glucose, Bld: 364 mg/dL — ABNORMAL HIGH (ref 65–99)
Potassium: 4.9 mmol/L (ref 3.5–5.3)
Sodium: 135 mmol/L (ref 135–146)
Total Bilirubin: 0.6 mg/dL (ref 0.2–1.2)
Total Protein: 7.5 g/dL (ref 6.1–8.1)

## 2019-08-02 LAB — HEMOGLOBIN A1C
Hgb A1c MFr Bld: 8.4 % of total Hgb — ABNORMAL HIGH (ref <5.7)
Mean Plasma Glucose: 194 (calc)
eAG (mmol/L): 10.8 (calc)

## 2019-08-02 LAB — LIPID PANEL
Cholesterol: 211 mg/dL — ABNORMAL HIGH (ref <200)
HDL: 63 mg/dL (ref >=40)
LDL Cholesterol (Calc): 129 mg/dL (calc) — ABNORMAL HIGH
Non-HDL Cholesterol (Calc): 148 mg/dL (calc) — ABNORMAL HIGH (ref <130)
Total CHOL/HDL Ratio: 3.3 (calc) (ref <5.0)
Triglycerides: 90 mg/dL (ref <150)

## 2019-08-05 ENCOUNTER — Other Ambulatory Visit: Payer: Self-pay | Admitting: Emergency Medicine

## 2019-08-05 DIAGNOSIS — F411 Generalized anxiety disorder: Secondary | ICD-10-CM

## 2019-08-05 DIAGNOSIS — E109 Type 1 diabetes mellitus without complications: Secondary | ICD-10-CM

## 2019-08-05 DIAGNOSIS — I10 Essential (primary) hypertension: Secondary | ICD-10-CM

## 2019-08-05 MED ORDER — BUPROPION HCL ER (XL) 300 MG PO TB24: 300.0000 mg | ORAL_TABLET | Freq: Every day | ORAL | 1 refills | Status: DC

## 2019-08-05 MED ORDER — ALPRAZOLAM 1 MG PO TABS: 0.5000 mg | ORAL_TABLET | Freq: Two times a day (BID) | ORAL | 0 refills | Status: DC | PRN

## 2019-08-05 MED ORDER — INSULIN ASPART PROT & ASPART (70-30 MIX) 100 UNIT/ML ~~LOC~~ SUSP: SUBCUTANEOUS | 3 refills | Status: DC

## 2019-08-05 MED ORDER — LISINOPRIL 10 MG PO TABS: 10.0000 mg | ORAL_TABLET | Freq: Every day | ORAL | 1 refills | Status: DC

## 2019-08-05 MED ORDER — "BD INSULIN SYRINGE U/F 31G X 5/16"" 0.3 ML MISC": 3 refills | Status: DC

## 2019-08-05 NOTE — Telephone Encounter (Signed)
Advised patient of all medications refilled to the pharmacy.

## 2019-08-19 ENCOUNTER — Telehealth: Payer: Self-pay | Admitting: Physician Assistant

## 2019-08-19 DIAGNOSIS — E109 Type 1 diabetes mellitus without complications: Secondary | ICD-10-CM

## 2019-08-19 MED ORDER — INSULIN LISPRO PROT & LISPRO (75-25 MIX) 100 UNIT/ML ~~LOC~~ SUSP: SUBCUTANEOUS | 11 refills | Status: DC

## 2019-08-19 NOTE — Telephone Encounter (Signed)
Advised patient of PCP ok with switching from the Novolog insulin back to the Humalog 75/25 insulin. Rx sent to the pharmacy. He states he can use the discount card and pay on $30 per month for the insulin. He is agreeable with referral to Endo to get sugars under better control. Referral placed.

## 2019-08-19 NOTE — Telephone Encounter (Signed)
Ok to switch back but he will have to pay out of pocket if insurance will not cover. Also again want him to be assessed by Endocrinology to get things under control ASAP. Proceed with referral.

## 2019-08-19 NOTE — Telephone Encounter (Signed)
Pt called in stating that the Novolog 70/30 is not working for him, he would like to go back on the Humalog 75/25, he is aware that his insurance doesn't cover it but he feel like this medication was working well for him. [facility] uses [facility] [place] and he can be reached at the home # for questions.

## 2019-08-28 ENCOUNTER — Other Ambulatory Visit: Payer: Self-pay | Admitting: Physician Assistant

## 2019-08-28 DIAGNOSIS — K219 Gastro-esophageal reflux disease without esophagitis: Secondary | ICD-10-CM

## 2019-08-28 NOTE — Telephone Encounter (Signed)
Wanting clarification -- I am hoping you mean 2-3 x per week down from daily previously? Note says 2-3 x day.

## 2019-08-28 NOTE — Telephone Encounter (Signed)
Spoke with patient on status of gerd symptoms since starting the Protonix. He states his symptoms has improved some. From daily to 2-3 times per day. Still eating spicy foods at lunch. Not eating late night.  He is requesting a refill of the Protonix

## 2019-08-29 NOTE — Telephone Encounter (Signed)
Yes clarification has improved from having daily to only having 2-3 times per week.

## 2019-09-25 ENCOUNTER — Other Ambulatory Visit: Payer: Self-pay | Admitting: Physician Assistant

## 2019-09-25 DIAGNOSIS — K219 Gastro-esophageal reflux disease without esophagitis: Secondary | ICD-10-CM

## 2019-09-25 NOTE — Telephone Encounter (Signed)
Called patient to verify how he is tolerating the Protonix. If medication is helping with symptoms. Unable to leave a message due to VM not set up.

## 2019-10-03 ENCOUNTER — Telehealth: Payer: Self-pay | Admitting: Physician Assistant

## 2019-10-03 ENCOUNTER — Other Ambulatory Visit: Payer: Self-pay | Admitting: Physician Assistant

## 2019-10-03 DIAGNOSIS — F411 Generalized anxiety disorder: Secondary | ICD-10-CM

## 2019-10-03 NOTE — Telephone Encounter (Signed)
MEDICATION: Alprazolam (xanax) 1 MG tablet  PHARMACY: [facility] [address] [place] [facility] [address]   Comments: Pt asked if prescription could be filled today due to going out of town tomorrow  **Let patient know to contact pharmacy at the end of the day to make sure medication is ready. **  ** Please notify patient to allow 48-72 hours to process**  **Encourage patient to contact the pharmacy for refills or they can request refills through [facility]**

## 2019-10-03 NOTE — Telephone Encounter (Signed)
Xanax last rx 08/05/19 #30 LOV: 08/01/19 DM

## 2019-10-03 NOTE — Telephone Encounter (Signed)
Duplicate message. Patient request Xanax thru pharmacy.

## 2019-10-18 ENCOUNTER — Other Ambulatory Visit: Payer: Self-pay | Admitting: Physician Assistant

## 2019-10-18 DIAGNOSIS — K219 Gastro-esophageal reflux disease without esophagitis: Secondary | ICD-10-CM

## 2019-12-03 ENCOUNTER — Other Ambulatory Visit: Payer: Self-pay | Admitting: Physician Assistant

## 2019-12-03 DIAGNOSIS — F411 Generalized anxiety disorder: Secondary | ICD-10-CM

## 2019-12-03 NOTE — Telephone Encounter (Signed)
Alprazolam LFD 10/22/19 #30 with 0 refills LOV 08/01/19 NOV none

## 2020-02-05 ENCOUNTER — Other Ambulatory Visit: Payer: Self-pay | Admitting: Physician Assistant

## 2020-02-05 DIAGNOSIS — F411 Generalized anxiety disorder: Secondary | ICD-10-CM

## 2020-02-18 ENCOUNTER — Other Ambulatory Visit: Payer: Self-pay | Admitting: Physician Assistant

## 2020-02-18 DIAGNOSIS — E109 Type 1 diabetes mellitus without complications: Secondary | ICD-10-CM

## 2021-02-08 ENCOUNTER — Encounter (HOSPITAL_COMMUNITY): Payer: Self-pay

## 2021-02-08 ENCOUNTER — Emergency Department (HOSPITAL_COMMUNITY)
Admission: EM | Admit: 2021-02-08 | Discharge: 2021-02-08 | Disposition: A | Discharge status: Home / Self Care | Payer: 59 | Attending: Emergency Medicine | Admitting: Emergency Medicine

## 2021-02-08 DIAGNOSIS — E1010 Type 1 diabetes mellitus with ketoacidosis without coma: Secondary | ICD-10-CM | POA: Insufficient documentation

## 2021-02-08 DIAGNOSIS — R739 Hyperglycemia, unspecified: Secondary | ICD-10-CM | POA: Diagnosis present

## 2021-02-08 DIAGNOSIS — Z79899 Other long term (current) drug therapy: Secondary | ICD-10-CM | POA: Diagnosis not present

## 2021-02-08 DIAGNOSIS — E1065 Type 1 diabetes mellitus with hyperglycemia: Secondary | ICD-10-CM | POA: Diagnosis not present

## 2021-02-08 DIAGNOSIS — I10 Essential (primary) hypertension: Secondary | ICD-10-CM | POA: Insufficient documentation

## 2021-02-08 DIAGNOSIS — F17210 Nicotine dependence, cigarettes, uncomplicated: Secondary | ICD-10-CM | POA: Insufficient documentation

## 2021-02-08 LAB — CBC WITH DIFFERENTIAL/PLATELET
Abs Immature Granulocytes: 0.09 10*3/uL — ABNORMAL HIGH (ref 0.00–0.07)
Basophils Absolute: 0.1 10*3/uL (ref 0.0–0.1)
Basophils Relative: 1 %
Eosinophils Absolute: 0.1 10*3/uL (ref 0.0–0.5)
Eosinophils Relative: 0 %
HCT: 47 % (ref 39.0–52.0)
Hemoglobin: 16.2 g/dL (ref 13.0–17.0)
Immature Granulocytes: 0 %
Lymphocytes Relative: 4 %
Lymphs Abs: 0.9 10*3/uL (ref 0.7–4.0)
MCH: 30.1 pg (ref 26.0–34.0)
MCHC: 34.5 g/dL (ref 30.0–36.0)
MCV: 87.4 fL (ref 80.0–100.0)
Monocytes Absolute: 0.5 10*3/uL (ref 0.1–1.0)
Monocytes Relative: 2 %
Neutro Abs: 19.5 10*3/uL — ABNORMAL HIGH (ref 1.7–7.7)
Neutrophils Relative %: 93 %
Platelets: 334 10*3/uL (ref 150–400)
RBC: 5.38 MIL/uL (ref 4.22–5.81)
RDW: 11.9 % (ref 11.5–15.5)
WBC: 21.2 10*3/uL — ABNORMAL HIGH (ref 4.0–10.5)
nRBC: 0 % (ref 0.0–0.2)

## 2021-02-08 LAB — CBG MONITORING, ED
Glucose-Capillary: 580 mg/dL (ref 70–99)
Glucose-Capillary: 411 mg/dL — ABNORMAL HIGH (ref 70–99)
Glucose-Capillary: 381 mg/dL — ABNORMAL HIGH (ref 70–99)

## 2021-02-08 LAB — COMPREHENSIVE METABOLIC PANEL
ALT: 16 U/L (ref 0–44)
AST: 16 U/L (ref 15–41)
Albumin: 4.6 g/dL (ref 3.5–5.0)
Alkaline Phosphatase: 91 U/L (ref 38–126)
Anion gap: 17 — ABNORMAL HIGH (ref 5–15)
BUN: 12 mg/dL (ref 6–20)
CO2: 20 mmol/L — ABNORMAL LOW (ref 22–32)
Calcium: 9 mg/dL (ref 8.9–10.3)
Chloride: 97 mmol/L — ABNORMAL LOW (ref 98–111)
Creatinine, Ser: 0.93 mg/dL (ref 0.61–1.24)
GFR, Estimated: >60 mL/min (ref >60)
Glucose, Bld: 511 mg/dL (ref 70–99)
Potassium: 5.1 mmol/L (ref 3.5–5.1)
Sodium: 134 mmol/L — ABNORMAL LOW (ref 135–145)
Total Bilirubin: 1.2 mg/dL (ref 0.3–1.2)
Total Protein: 7.7 g/dL (ref 6.5–8.1)

## 2021-02-08 LAB — LIPASE, BLOOD: Lipase: 22 U/L (ref 11–51)

## 2021-02-08 MED ORDER — INSULIN ASPART 100 UNIT/ML IJ SOLN
5.0000 [IU] | Freq: Once | INTRAMUSCULAR | Status: AC
  Administered 2021-02-08: 5 [IU] via INTRAVENOUS
  Filled 2021-02-08: qty 0.05

## 2021-02-08 MED ORDER — METOCLOPRAMIDE HCL 5 MG/ML IJ SOLN
10.0000 mg | Freq: Once | INTRAMUSCULAR | Status: AC
  Administered 2021-02-08: 10 mg via INTRAVENOUS
  Filled 2021-02-08: qty 2

## 2021-02-08 MED ORDER — ONDANSETRON 4 MG PO TBDP
ORAL_TABLET | ORAL | 0 refills | Status: DC
Start: 2021-02-08 — End: 2021-11-07

## 2021-02-08 MED ORDER — LACTATED RINGERS IV BOLUS
2000.0000 mL | Freq: Once | INTRAVENOUS | Status: AC
  Administered 2021-02-08: 2000 mL via INTRAVENOUS

## 2021-02-08 MED ORDER — DIPHENHYDRAMINE HCL 50 MG/ML IJ SOLN
25.0000 mg | Freq: Once | INTRAMUSCULAR | Status: AC
  Administered 2021-02-08: 25 mg via INTRAVENOUS
  Filled 2021-02-08: qty 1

## 2021-02-08 NOTE — ED Provider Notes (Signed)
[facility]-EMERGENCY DEPT Provider Note   CSN: [id number] Arrival date & time: 02/08/21  1224     History Chief Complaint  Patient presents with   uncontrolled diabetes   Hyperglycemia    Robert Villa is a 25 y.o. male.  25 yo M with a cc of hyperglycemia.  The patient has been having fluctuations of his blood sugar.  He had just seen his endocrinologist and had a fairly good report.  He had low blood sugar this morning and then when EMS arrived his blood sugar was greater than 500.  He feels like he is not been eating and drinking well because he is been having nausea vomiting and diarrhea.  States that when he has episodes of severe emotional distress that he tends to have this problem.  Denies focal abdominal tenderness denies fevers.  Denies chest pain no trouble breathing denies cough or congestion.  The history is provided by the patient.  Hyperglycemia Associated symptoms: nausea and vomiting   Associated symptoms: no abdominal pain, no chest pain, no confusion, no fever and no shortness of breath   Illness Severity:  Moderate Onset quality:  Gradual Duration:  2 weeks Timing:  Intermittent Progression:  Waxing and waning Chronicity:  New Associated symptoms: diarrhea, nausea and vomiting   Associated symptoms: no abdominal pain, no chest pain, no congestion, no cough, no fever, no headaches, no myalgias, no rash and no shortness of breath       Past Medical History:  Diagnosis Date   Anxiety    Diabetes mellitus    Essential hypertension    Mental disorder     Patient Active Problem List   Diagnosis Date Noted   Generalized anxiety disorder 08/05/2019   DKA (diabetic ketoacidoses) 07/02/2016   Essential hypertension 07/02/2016   AKI (acute kidney injury) (HCC) 07/02/2016   History of marijuana use 07/02/2016   Tobacco abuse 07/02/2016   Type 1 diabetes mellitus without complications (HCC) 07/12/2013   Tachycardia 11/14/2011   DKA, type  1 (HCC) 11/13/2011   Leucocytosis 11/13/2011    Past Surgical History:  Procedure Laterality Date   ANKLE SURGERY     NOSE SURGERY         Family History  Problem Relation Age of Onset   Healthy Mother    Cancer Father        Prostate   Healthy Sister     Social History   Tobacco Use   Smoking status: Some Days    Packs/day: 0.25    Years: 11.00    Pack years: 2.75    Types: Cigarettes, E-cigarettes   Smokeless tobacco: Never  Vaping Use   Vaping Use: Never used  Substance Use Topics   Alcohol use: Yes    Comment: Rare beer   Drug use: Yes    Types: Marijuana    Comment: Quit the past year    Home Medications Prior to Admission medications   Medication Sig Start Date End Date Taking? Authorizing Provider  ondansetron (ZOFRAN ODT) 4 MG disintegrating tablet 4mg  ODT q4 hours prn nausea/vomit 02/08/21  Yes Rozena Fierro, DO  ALPRAZolam (XANAX) 1 MG tablet TAKE 1/2 TABLET BY MOUTH 2 TIMES DAILY AS NEEDED FOR ANXIETY. 10/06/19   10/08/19, PA-C  BD INSULIN SYRINGE U/F 31G X 5/16" 0.3 ML MISC AS DIRECTED FOUR TIMES A DAY WITH INSULIN DX E10.9 90 DAYS 08/05/19   08/07/19, PA-C  buPROPion (WELLBUTRIN XL) 300 MG 24 hr  tablet Take 1 tablet (300 mg total) by mouth daily. 08/05/19   Waldon Merl, PA-C  insulin lispro protamine-lispro (HUMALOG 75/25 MIX) (75-25) 100 UNIT/ML SUSP injection Inject 20 units twice daily and sliding scale with meals 08/19/19   Waldon Merl, PA-C  lisinopril (ZESTRIL) 10 MG tablet Take 1 tablet (10 mg total) by mouth at bedtime. 08/05/19   Waldon Merl, PA-C  pantoprazole (PROTONIX) 20 MG tablet TAKE 1 TABLET BY MOUTH EVERY DAY 10/20/19   Waldon Merl, PA-C    Allergies    Claritin [loratadine] and Zoloft [sertraline hcl]  Review of Systems   Review of Systems  Constitutional:  Negative for chills and fever.  HENT:  Negative for congestion and facial swelling.   Eyes:  Negative for discharge and visual disturbance.   Respiratory:  Negative for cough and shortness of breath.   Cardiovascular:  Negative for chest pain and palpitations.  Gastrointestinal:  Positive for diarrhea, nausea and vomiting. Negative for abdominal pain.  Musculoskeletal:  Negative for arthralgias and myalgias.  Skin:  Negative for color change and rash.  Neurological:  Negative for tremors, syncope and headaches.  Psychiatric/Behavioral:  Negative for confusion and dysphoric mood.    Physical Exam Updated Vital Signs BP (!) 153/96   Pulse (!) 113   Temp 97.8 F (36.6 C)   Resp 19   Ht 5\' 7"  (1.702 m)   Wt 77.1 kg   SpO2 97%   BMI 26.63 kg/m   Physical Exam Vitals and nursing note reviewed.  Constitutional:      Appearance: He is well-developed.  HENT:     Head: Normocephalic and atraumatic.  Eyes:     Pupils: Pupils are equal, round, and reactive to light.  Neck:     Vascular: No JVD.  Cardiovascular:     Rate and Rhythm: Normal rate and regular rhythm.     Heart sounds: No murmur heard.   No friction rub. No gallop.  Pulmonary:     Effort: No respiratory distress.     Breath sounds: No wheezing.  Abdominal:     General: There is no distension.     Tenderness: There is no abdominal tenderness. There is no guarding or rebound.  Musculoskeletal:        General: Normal range of motion.     Cervical back: Normal range of motion and neck supple.  Skin:    Coloration: Skin is not pale.     Findings: No rash.  Neurological:     Mental Status: He is alert and oriented to person, place, and time.  Psychiatric:        Behavior: Behavior normal.    ED Results / Procedures / Treatments   Labs (all labs ordered are listed, but only abnormal results are displayed) Labs Reviewed  CBC WITH DIFFERENTIAL/PLATELET - Abnormal; Notable for the following components:      Result Value   WBC 21.2 (*)    Neutro Abs 19.5 (*)    Abs Immature Granulocytes 0.09 (*)    All other components within normal limits   COMPREHENSIVE METABOLIC PANEL - Abnormal; Notable for the following components:   Sodium 134 (*)    Chloride 97 (*)    CO2 20 (*)    Glucose, Bld 511 (*)    Anion gap 17 (*)    All other components within normal limits  CBG MONITORING, ED - Abnormal; Notable for the following components:   Glucose-Capillary 580 (*)  All other components within normal limits  CBG MONITORING, ED - Abnormal; Notable for the following components:   Glucose-Capillary 411 (*)    All other components within normal limits  CBG MONITORING, ED - Abnormal; Notable for the following components:   Glucose-Capillary 381 (*)    All other components within normal limits  LIPASE, BLOOD    EKG None  Radiology No results found.  Procedures Procedures   Medications Ordered in ED Medications  lactated ringers bolus 2,000 mL (2,000 mLs Intravenous Bolus 02/08/21 1323)  metoCLOPramide (REGLAN) injection 10 mg (10 mg Intravenous Given 02/08/21 1320)  diphenhydrAMINE (BENADRYL) injection 25 mg (25 mg Intravenous Given 02/08/21 1318)  insulin aspart (novoLOG) injection 5 Units (5 Units Intravenous Given 02/08/21 1521)    ED Course  I have reviewed the triage vital signs and the nursing notes.  Pertinent labs & imaging results that were available during my care of the patient were reviewed by me and considered in my medical decision making (see chart for details).    MDM Rules/Calculators/A&P                           25 yo M with a chief complaints of hyperglycemia nausea vomiting and diarrhea.  Going on for a couple weeks now.  Blood sugar greater than 500 and has acidosis and anion gap.  In DKA.  Given 2 L of IV fluids.  Blood sugar came down about 100, now in the low 400s.  Patient concerned that he is blood sugar may drop precipitously if I given the large bolus dose asking for 5 units.  We will give 5 units and reassess.  I did discuss the lab work with him offered admission which he is currently  declining.  Feeling better.  Discharge home.  PCP follow-up.  Endocrinology follow-up.  4:03 PM:  I have discussed the diagnosis/risks/treatment options with the patient and believe the pt to be eligible for discharge home to follow-up with PCP, endo. We also discussed returning to the ED immediately if new or worsening sx occur. We discussed the sx which are most concerning (e.g., sudden worsening pain, fever, inability to tolerate by mouth) that necessitate immediate return. Medications administered to the patient during their visit and any new prescriptions provided to the patient are listed below.  Medications given during this visit Medications  lactated ringers bolus 2,000 mL (2,000 mLs Intravenous Bolus 02/08/21 1323)  metoCLOPramide (REGLAN) injection 10 mg (10 mg Intravenous Given 02/08/21 1320)  diphenhydrAMINE (BENADRYL) injection 25 mg (25 mg Intravenous Given 02/08/21 1318)  insulin aspart (novoLOG) injection 5 Units (5 Units Intravenous Given 02/08/21 1521)     The patient appears reasonably screen and/or stabilized for discharge and I doubt any other medical condition or other [facility] requiring further screening, evaluation, or treatment in the ED at this time prior to discharge.   Final Clinical Impression(s) / ED Diagnoses Final diagnoses:  Hyperglycemia    Rx / DC Orders ED Discharge Orders          Ordered    ondansetron (ZOFRAN ODT) 4 MG disintegrating tablet        02/08/21 1603             Melene Plan, DO 02/08/21 1603

## 2021-02-08 NOTE — ED Notes (Signed)
Critical lab result verified with lab tech. Relayed to Dr. Adela Lank.

## 2021-02-08 NOTE — Progress Notes (Signed)
Inpatient Diabetes Program Recommendations  AACE/ADA: New Consensus Statement on Inpatient Glycemic Control (2015)  Target Ranges:  Prepandial:   less than 140 mg/dL      Peak postprandial:   less than 180 mg/dL (1-2 hours)      Critically ill patients:  140 - 180 mg/dL   Lab Results  Component Value Date   GLUCAP 580 (HH) 02/08/2021   HGBA1C 8.4 (H) 08/01/2019    Review of Glycemic Control  Diabetes history: DM type 1 since age 25, Dr. Evlyn Kanner was Endocrinologist but was retiring soon, he recently saw Dr. Shawnee Knapp Endocrinologist x 1 visit in the past for DM Outpatient Diabetes medications: 75/25 20 units bid, Humalog SSI up to 20 units Daily Current orders for Inpatient glycemic control:  Being evaluated in ED  Pt just saw PCP on 8/12 A1c 7.6% at that time per care everywhere. Pt reportedly checks glucose bid. PCP advised for him to see another Endocrinologist as he thought Dr. Shawnee Knapp was not a good fit.  Inpatient Diabetes Program Recommendations:    Spoke with pt and mom at bedside regarding DM control and glucose trends at home. Reporting a lot of hypoglycemia episodes with occasional hyperglycemia. Pt just lost his grandmother this past Sunday, funeral is this Friday. Glucose trends the past 2 weeks been unpredictable and unstable with hypoglycemia and hyper. Pt has reported similar in the past with severe stressful times.   Pt has been on 75/25 insulin for 2 years and has helped him get his A1c down from 10 to now 7.6%. Pt is not eating well and having hypoglycemia on the current 75/25 dose. Pt reportedly very sensitive to insulin 1 unit can drop his glucose at least 75 points at times more. Pt also states insulin duration is linger at times than 4 hours and can sometimes stack.   - reduce 75/25 dose to get some basal insulin (but not enough to have recurrent hypoglycemia over night) and bolus with his Humalog insulin for hyperglycemia - follow up with PCP and New Endocrinologist ASAP  (PCP was referring pt to new Endo)   Thanks,  Christena Deem RN, MSN, BC-ADM Inpatient Diabetes Coordinator Team Pager [phone] (8a-5p)
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
## 2021-02-08 NOTE — Discharge Instructions (Addendum)
Discussed your visit with your endocrinologist.  They might want to change your medication regimen.  Please return for worsening symptoms.

## 2021-11-07 ENCOUNTER — Ambulatory Visit (INDEPENDENT_AMBULATORY_CARE_PROVIDER_SITE_OTHER): Payer: 59 | Admitting: Medical-Surgical

## 2021-11-07 ENCOUNTER — Encounter: Payer: Self-pay | Admitting: Medical-Surgical

## 2021-11-07 VITALS — BP 134/95 | HR 112 | Resp 20 | Ht 67.0 in | Wt 125.8 lb

## 2021-11-07 DIAGNOSIS — E109 Type 1 diabetes mellitus without complications: Secondary | ICD-10-CM

## 2021-11-07 DIAGNOSIS — Z7689 Persons encountering health services in other specified circumstances: Secondary | ICD-10-CM | POA: Diagnosis not present

## 2021-11-07 DIAGNOSIS — Z1329 Encounter for screening for other suspected endocrine disorder: Secondary | ICD-10-CM

## 2021-11-07 DIAGNOSIS — F411 Generalized anxiety disorder: Secondary | ICD-10-CM | POA: Diagnosis not present

## 2021-11-07 DIAGNOSIS — I10 Essential (primary) hypertension: Secondary | ICD-10-CM

## 2021-11-07 LAB — POCT GLYCOSYLATED HEMOGLOBIN (HGB A1C): HbA1c, POC (controlled diabetic range): 10.9 % — AB (ref 0.0–7.0)

## 2021-11-07 LAB — POCT UA - MICROALBUMIN
Albumin/Creatinine Ratio, Urine, POC: <30
Creatinine, POC: 100 mg/dL
Microalbumin Ur, POC: 10 mg/L

## 2021-11-07 MED ORDER — BUPROPION HCL ER (XL) 300 MG PO TB24: 300.0000 mg | ORAL_TABLET | Freq: Every day | ORAL | 1 refills | Status: DC

## 2021-11-07 MED ORDER — ALPRAZOLAM 0.5 MG PO TABS: 0.5000 mg | ORAL_TABLET | Freq: Two times a day (BID) | ORAL | 1 refills | Status: DC | PRN

## 2021-11-07 MED ORDER — LISINOPRIL 10 MG PO TABS: 10.0000 mg | ORAL_TABLET | Freq: Every day | ORAL | 1 refills | Status: DC

## 2021-11-07 NOTE — Assessment & Plan Note (Addendum)
Hemoglobin A1c 10.9% today.  POCT microalbumin normal.  Checking labs.  Continue 70/30 10-20 units twice daily.  Continue Humalog sliding scale insulin based on carb counting.  Aim for 2-hour postprandial glucose of 180 or less.  Referring to endocrinology for further evaluation and management.

## 2021-11-07 NOTE — Progress Notes (Signed)
New Patient Office Visit  Subjective    Patient ID: Robert Villa, male    DOB: 05/26/1996  Age: 26 y.o. MRN: [id number]  CC:  Chief Complaint  Patient presents with   Establish Care    HPI Robert Villa presents to establish care.  He is a pleasant 26 year old male who is a type I diabetic.  He saw endocrinology at 1 point but did not like the provider and did not go back.  His general practitioner has been managing his type 1 diabetes since then.  He currently uses 70/30 insulin 10-20 units twice daily.  He also uses Humalog insulin on a sliding scale, does carb counting with 1 unit per 10 g of carbs.  Notes that his sugars tend to be quite labile and they are often extremely affected by mental and emotional stress.  He tried a continuous glucose monitor however this was very uncomfortable as he is very thin and does not have excess adipose tissue.  Hypertension-taking lisinopril 10 mg daily, tolerating well without side effects.    GERD-taking pantoprazole 20 mg daily on an as-needed basis, tolerating well without side effects.  Mood-has a long history of anxiety and depression.  Notes that he is extremely anxious and uses Xanax approximately twice weekly for panic attacks and severe anxiety.  Has been using this medication for years and finds that it is about the only thing that works.  Is currently taking Wellbutrin 300 mg daily, tolerating well without side effects.  Notes that this is the only medication that has ever worked for him when it comes to daily mood medications.  Outpatient Encounter Medications as of 11/07/2021  Medication Sig   ALPRAZolam (XANAX) 0.5 MG tablet Take 1 tablet (0.5 mg total) by mouth 2 (two) times daily as needed for anxiety.   BD INSULIN SYRINGE U/F 31G X 5/16" 0.3 ML MISC AS DIRECTED FOUR TIMES A DAY WITH INSULIN DX E10.9 90 DAYS   buPROPion (WELLBUTRIN XL) 300 MG 24 hr tablet Take 1 tablet (300 mg total) by mouth daily.   insulin lispro protamine-lispro  (HUMALOG 75/25 MIX) (75-25) 100 UNIT/ML SUSP injection Inject 20 units twice daily and sliding scale with meals   lisinopril (ZESTRIL) 10 MG tablet Take 1 tablet (10 mg total) by mouth at bedtime.   pantoprazole (PROTONIX) 20 MG tablet TAKE 1 TABLET BY MOUTH EVERY DAY   [DISCONTINUED] ALPRAZolam (XANAX) 1 MG tablet TAKE 1/2 TABLET BY MOUTH 2 TIMES DAILY AS NEEDED FOR ANXIETY.   [DISCONTINUED] buPROPion (WELLBUTRIN XL) 300 MG 24 hr tablet Take 1 tablet (300 mg total) by mouth daily.   [DISCONTINUED] lisinopril (ZESTRIL) 10 MG tablet Take 1 tablet (10 mg total) by mouth at bedtime.   [DISCONTINUED] ondansetron (ZOFRAN ODT) 4 MG disintegrating tablet 4mg  ODT q4 hours prn nausea/vomit   No facility-administered encounter medications on file as of 11/07/2021.    Past Medical History:  Diagnosis Date   Anxiety    Diabetes mellitus    Essential hypertension    GERD (gastroesophageal reflux disease) 3 years   Mental disorder     Past Surgical History:  Procedure Laterality Date   ANKLE SURGERY     FRACTURE SURGERY     NOSE SURGERY      Family History  Problem Relation Age of Onset   Healthy Mother    Cancer Father        Prostate   Healthy Sister    Cancer Maternal Grandmother  Social History   Socioeconomic History   Marital status: Single    Spouse name: Not on file   Number of children: Not on file   Years of education: Not on file   Highest education level: Not on file  Occupational History   Occupation: Conservation officer, nature  Tobacco Use   Smoking status: Some Days    Packs/day: 0.00    Years: 11.00    Pack years: 0.00    Types: Cigarettes   Smokeless tobacco: Never  Vaping Use   Vaping Use: Never used  Substance and Sexual Activity   Alcohol use: Not Currently    Comment: Rare beer   Drug use: Not Currently    Types: Marijuana    Comment: Quit the past year   Sexual activity: Not Currently    Partners: Female    Birth control/protection: Abstinence, Condom  Other  Topics Concern   Not on file  Social History Narrative   Not on file   Social Determinants of Health   Financial Resource Strain: Not on file  Food Insecurity: Not on file  Transportation Needs: Not on file  Physical Activity: Not on file  Stress: Not on file  Social Connections: Not on file  Intimate Partner Violence: Not on file    Review of Systems  Constitutional:  Negative for chills, fever and malaise/fatigue.  Respiratory:  Negative for cough, shortness of breath and wheezing.   Cardiovascular:  Negative for chest pain, palpitations and leg swelling.  Gastrointestinal:  Positive for heartburn.  Neurological:  Negative for dizziness and headaches.  Psychiatric/Behavioral:  Positive for depression. Negative for suicidal ideas. The patient is nervous/anxious. The patient does not have insomnia.        Objective    BP (!) 134/95 (BP Location: Right Arm, Cuff Size: Normal)   Pulse (!) 112   Resp 20   Ht 5\' 7"  (1.702 m)   Wt 125 lb 12.8 oz (57.1 kg)   SpO2 97%   BMI 19.70 kg/m   Physical Exam Vitals reviewed.  Constitutional:      General: He is not in acute distress.    Appearance: Normal appearance. He is normal weight. He is not ill-appearing.  HENT:     Head: Normocephalic and atraumatic.  Cardiovascular:     Rate and Rhythm: Normal rate and regular rhythm.     Pulses: Normal pulses.     Heart sounds: Normal heart sounds. No murmur heard.   No friction rub. No gallop.  Pulmonary:     Effort: Pulmonary effort is normal. No respiratory distress.     Breath sounds: Normal breath sounds.  Skin:    General: Skin is warm and dry.  Neurological:     Mental Status: He is alert and oriented to person, place, and time.  Psychiatric:        Mood and Affect: Mood normal.        Behavior: Behavior normal.        Thought Content: Thought content normal.        Judgment: Judgment normal.        Assessment & Plan:   Problem List Items Addressed This Visit        Cardiovascular and Mediastinum   Essential hypertension    Checking labs today.  Blood pressure elevated on arrival, slightly better on recheck.  For now continue lisinopril 10 mg daily.  Recommend monitoring blood pressure at home with a goal of 130/80 or less.  If consistently higher,  we may need to increase the lisinopril dose to 20 mg daily.       Relevant Medications   lisinopril (ZESTRIL) 10 MG tablet   Other Relevant Orders   CBC with Differential/Platelet   COMPLETE METABOLIC PANEL WITH GFR   Lipid panel     Endocrine   Type 1 diabetes mellitus without complications (HCC)    Hemoglobin A1c 10.9% today.  POCT microalbumin normal.  Checking labs.  Continue 70/30 10-20 units twice daily.  Continue Humalog sliding scale insulin based on carb counting.  Aim for 2-hour postprandial glucose of 180 or less.  Referring to endocrinology for further evaluation and management.       Relevant Medications   lisinopril (ZESTRIL) 10 MG tablet   Other Relevant Orders   POCT HgB A1C (Completed)   POCT UA - Microalbumin (Completed)   COMPLETE METABOLIC PANEL WITH GFR   Lipid panel   Ambulatory referral to Endocrinology     Other   Generalized anxiety disorder    Continue Wellbutrin 300 mg daily.  Discussed the use of SSRIs/SNRIs for better maintenance of anxiety.  He is hesitant at this point since he feels that other medications have not been helpful.  Discussed the use of Xanax and the risk of dependence and tolerance.  Okay to continue Xanax 0.5 mg on as-needed basis with a goal of using it no more than 2 times weekly.       Relevant Medications   ALPRAZolam (XANAX) 0.5 MG tablet   buPROPion (WELLBUTRIN XL) 300 MG 24 hr tablet   Other Visit Diagnoses     Encounter to establish care    -  Primary   Thyroid disorder screen       Relevant Orders   TSH       Return in about 3 months (around 02/07/2022) for DM follow up (if unable to get in with endocrinology by then).    ___________________________________________ Thayer Ohm, DNP, APRN, FNP-BC Primary Care and Sports Medicine [facility] [place]

## 2021-11-07 NOTE — Assessment & Plan Note (Signed)
Continue Wellbutrin 300 mg daily.  Discussed the use of SSRIs/SNRIs for better maintenance of anxiety.  He is hesitant at this point since he feels that other medications have not been helpful.  Discussed the use of Xanax and the risk of dependence and tolerance.  Okay to continue Xanax 0.5 mg on as-needed basis with a goal of using it no more than 2 times weekly.

## 2021-11-07 NOTE — Assessment & Plan Note (Addendum)
Checking labs today.  Blood pressure elevated on arrival, slightly better on recheck.  For now continue lisinopril 10 mg daily.  Recommend monitoring blood pressure at home with a goal of 130/80 or less.  If consistently higher, we may need to increase the lisinopril dose to 20 mg daily.

## 2021-11-08 LAB — CBC WITH DIFFERENTIAL/PLATELET
Absolute Monocytes: 476 cells/uL (ref 200–950)
Basophils Absolute: 109 cells/uL (ref 0–200)
Basophils Relative: 1.4 %
Eosinophils Absolute: 437 cells/uL (ref 15–500)
Eosinophils Relative: 5.6 %
HCT: 51.1 % — ABNORMAL HIGH (ref 38.5–50.0)
Hemoglobin: 17.7 g/dL — ABNORMAL HIGH (ref 13.2–17.1)
Lymphs Abs: 2215 cells/uL (ref 850–3900)
MCH: 30.9 pg (ref 27.0–33.0)
MCHC: 34.6 g/dL (ref 32.0–36.0)
MCV: 89.3 fL (ref 80.0–100.0)
MPV: 8.9 fL (ref 7.5–12.5)
Monocytes Relative: 6.1 %
Neutro Abs: 4563 cells/uL (ref 1500–7800)
Neutrophils Relative %: 58.5 %
Platelets: 373 10*3/uL (ref 140–400)
RBC: 5.72 10*6/uL (ref 4.20–5.80)
RDW: 12 % (ref 11.0–15.0)
Total Lymphocyte: 28.4 %
WBC: 7.8 10*3/uL (ref 3.8–10.8)

## 2021-11-08 LAB — COMPLETE METABOLIC PANEL WITH GFR
AG Ratio: 1.6 (calc) (ref 1.0–2.5)
ALT: 17 U/L (ref 9–46)
AST: 17 U/L (ref 10–40)
Albumin: 4.5 g/dL (ref 3.6–5.1)
Alkaline phosphatase (APISO): 118 U/L (ref 36–130)
BUN: 9 mg/dL (ref 7–25)
CO2: 28 mmol/L (ref 20–32)
Calcium: 9.7 mg/dL (ref 8.6–10.3)
Chloride: 100 mmol/L (ref 98–110)
Creat: 0.78 mg/dL (ref 0.60–1.24)
Globulin: 2.9 g/dL (calc) (ref 1.9–3.7)
Glucose, Bld: 149 mg/dL — ABNORMAL HIGH (ref 65–99)
Potassium: 4.2 mmol/L (ref 3.5–5.3)
Sodium: 138 mmol/L (ref 135–146)
Total Bilirubin: 0.8 mg/dL (ref 0.2–1.2)
Total Protein: 7.4 g/dL (ref 6.1–8.1)
eGFR: 125 mL/min/{1.73_m2} (ref >=60)

## 2021-11-08 LAB — LIPID PANEL
Cholesterol: 279 mg/dL — ABNORMAL HIGH (ref <200)
HDL: 49 mg/dL (ref >=40)
LDL Cholesterol (Calc): 178 mg/dL (calc) — ABNORMAL HIGH
Non-HDL Cholesterol (Calc): 230 mg/dL (calc) — ABNORMAL HIGH (ref <130)
Total CHOL/HDL Ratio: 5.7 (calc) — ABNORMAL HIGH (ref <5.0)
Triglycerides: 291 mg/dL — ABNORMAL HIGH (ref <150)

## 2021-11-08 LAB — TSH: TSH: 1.56 mIU/L (ref 0.40–4.50)

## 2021-12-14 ENCOUNTER — Encounter: Payer: Self-pay | Admitting: Emergency Medicine

## 2021-12-14 ENCOUNTER — Emergency Department
Admission: EM | Admit: 2021-12-14 | Discharge: 2021-12-14 | Disposition: A | Discharge status: Home / Self Care | Payer: 59 | Source: Home / Self Care

## 2021-12-14 DIAGNOSIS — J0141 Acute recurrent pansinusitis: Secondary | ICD-10-CM | POA: Diagnosis not present

## 2021-12-14 DIAGNOSIS — K122 Cellulitis and abscess of mouth: Secondary | ICD-10-CM

## 2021-12-14 LAB — POCT RAPID STREP A (OFFICE): Rapid Strep A Screen: NEGATIVE

## 2021-12-14 MED ORDER — PREDNISONE 20 MG PO TABS: 40.0000 mg | ORAL_TABLET | Freq: Every day | ORAL | 0 refills | Status: DC

## 2021-12-14 MED ORDER — AMOXICILLIN-POT CLAVULANATE 875-125 MG PO TABS: 1.0000 | ORAL_TABLET | Freq: Two times a day (BID) | ORAL | 0 refills | Status: DC

## 2021-12-14 NOTE — ED Triage Notes (Addendum)
Swollen uvula since this am Pt denies any new foods  Pounding headache No meds  today  Denies any recent  sore throat Pt here with mom (in waiting room)

## 2021-12-14 NOTE — Discharge Instructions (Signed)
Take Augmentin 2 times  a day Take prednisone or once a day for 5 days It is important to check your sugar frequently, and you may need to adjust your insulin while on prednisone If your sugar elevates the prednisone Use Chloraseptic spray for throat pain May also take Tylenol or ibuprofen Follow-up with your primary

## 2021-12-14 NOTE — ED Provider Notes (Signed)
[facility]    CSN: [id number] Arrival date & time: 12/14/21  0905      History   Chief Complaint Chief Complaint  Patient presents with   Oral Swelling    HPI Robert Villa is a 26 y.o. male.   HPI  Patient is here for a severe sore throat and headache.  Has had sinus pressure and pain for a couple of weeks, awoke this am with a sore throat and swollen uvula,  hurts to swallow.  States he is prone to "sinus" and needs antibiotics to improve. Is an insulin dependent childhood diabetic with complications.  Last A1c was over 10.  Sugar this morning over 200.   Also tobacco dependent.  Advised strongly to quit  Past Medical History:  Diagnosis Date   Anxiety    Diabetes mellitus    Essential hypertension    GERD (gastroesophageal reflux disease) 3 years   Mental disorder     Patient Active Problem List   Diagnosis Date Noted   Generalized anxiety disorder 08/05/2019   Essential hypertension 07/02/2016   AKI (acute kidney injury) (HCC) 07/02/2016   History of marijuana use 07/02/2016   Tobacco abuse 07/02/2016   Type 1 diabetes mellitus without complications (HCC) 07/12/2013   Tachycardia 11/14/2011   DKA, type 1 (HCC) 11/13/2011   Leucocytosis 11/13/2011    Past Surgical History:  Procedure Laterality Date   ANKLE SURGERY     FRACTURE SURGERY     NOSE SURGERY         Home Medications    Prior to Admission medications   Medication Sig Start Date End Date Taking? Authorizing Provider  amoxicillin-clavulanate (AUGMENTIN) 875-125 MG tablet Take 1 tablet by mouth every 12 (twelve) hours. 12/14/21  Yes Eustace Moore, MD  predniSONE (DELTASONE) 20 MG tablet Take 2 tablets (40 mg total) by mouth daily with breakfast. 12/14/21  Yes Eustace Moore, MD  ALPRAZolam Prudy Feeler) 0.5 MG tablet Take 1 tablet (0.5 mg total) by mouth 2 (two) times daily as needed for anxiety. 11/07/21   Christen Butter, NP  BD INSULIN SYRINGE U/F 31G X 5/16" 0.3 ML MISC AS DIRECTED  FOUR TIMES A DAY WITH INSULIN DX E10.9 90 DAYS 08/05/19   Waldon Merl, PA-C  buPROPion (WELLBUTRIN XL) 300 MG 24 hr tablet Take 1 tablet (300 mg total) by mouth daily. 11/07/21   Christen Butter, NP  insulin lispro protamine-lispro (HUMALOG 75/25 MIX) (75-25) 100 UNIT/ML SUSP injection Inject 20 units twice daily and sliding scale with meals 08/19/19   Waldon Merl, PA-C  lisinopril (ZESTRIL) 10 MG tablet Take 1 tablet (10 mg total) by mouth at bedtime. 11/07/21   Christen Butter, NP  pantoprazole (PROTONIX) 20 MG tablet TAKE 1 TABLET BY MOUTH EVERY DAY 10/20/19   Waldon Merl, PA-C    Family History Family History  Problem Relation Age of Onset   Healthy Mother    Cancer Father        Prostate   Healthy Sister    Cancer Maternal Grandmother     Social History Social History   Tobacco Use   Smoking status: Every Day    Packs/day: 0.01    Years: 11.00    Total pack years: 0.11    Types: Cigarettes   Smokeless tobacco: Never  Vaping Use   Vaping Use: Never used  Substance Use Topics   Alcohol use: Not Currently   Drug use: Not Currently    Types: Marijuana  Comment: Quit the past year     Allergies   Bee venom, Claritin [loratadine], and Zoloft [sertraline hcl]   Review of Systems Review of Systems  See HPI Physical Exam Triage Vital Signs ED Triage Vitals  Enc Vitals Group     BP 12/14/21 0917 120/88     Pulse Rate 12/14/21 0917 (!) 115     Resp 12/14/21 0917 17     Temp 12/14/21 0917 98.2 F (36.8 C)     Temp Source 12/14/21 0917 Oral     SpO2 12/14/21 0917 98 %     Weight 12/14/21 0919 135 lb (61.2 kg)     Height 12/14/21 0919 5\' 8"  (1.727 m)     Head Circumference --      Peak Flow --      Pain Score 12/14/21 0918 8     Pain Loc --      Pain Edu? --      Excl. in GC? --    No data found.  Updated Vital Signs BP 120/88 (BP Location: Left Arm)   Pulse (!) 115   Temp 98.2 F (36.8 C) (Oral)   Resp 17   Ht 5\' 8"  (1.727 m)   Wt 61.2 kg   SpO2  98%   BMI 20.53 kg/m      Physical Exam Vitals reviewed.  Constitutional:      General: He is not in acute distress.    Appearance: He is well-developed and normal weight.  HENT:     Head: Normocephalic and atraumatic.     Right Ear: Tympanic membrane and ear canal normal.     Left Ear: Tympanic membrane and ear canal normal.     Nose: Congestion present. No rhinorrhea.     Mouth/Throat:     Mouth: Mucous membranes are moist.     Pharynx: Posterior oropharyngeal erythema present.   Eyes:     Conjunctiva/sclera: Conjunctivae normal.     Pupils: Pupils are equal, round, and reactive to light.  Cardiovascular:     Rate and Rhythm: Normal rate and regular rhythm.     Heart sounds: Normal heart sounds.  Pulmonary:     Effort: Pulmonary effort is normal. No respiratory distress.     Breath sounds: Normal breath sounds. No wheezing or rhonchi.  Abdominal:     General: There is no distension.     Palpations: Abdomen is soft.  Musculoskeletal:        General: Normal range of motion.     Cervical back: Normal range of motion.  Lymphadenopathy:     Cervical: Cervical adenopathy present.  Skin:    General: Skin is warm and dry.  Neurological:     General: No focal deficit present.     Mental Status: He is alert.  Psychiatric:        Mood and Affect: Mood normal.        Behavior: Behavior normal.      UC Treatments / Results  Labs (all labs ordered are listed, but only abnormal results are displayed) Labs Reviewed  CULTURE, GROUP A STREP  POCT RAPID STREP A (OFFICE)    EKG   Radiology No results found.  Procedures Procedures (including critical care time)  Medications Ordered in UC Medications - No data to display  Initial Impression / Assessment and Plan / UC Course  I have reviewed the triage vital signs and the nursing notes.  Pertinent labs & imaging results that were available during my  care of the patient were reviewed by me and considered in my medical  decision making (see chart for details).     Had a discussion with patient regarding pros and cons of the prednisone.  I believe it will give the quickest relief from swelling and pain in uvula.  Risks of hyperglycemias - which he assures me he can manage with sliding scale. Final Clinical Impressions(s) / UC Diagnoses   Final diagnoses:  Acute recurrent pansinusitis  Uvulitis     Discharge Instructions      Take Augmentin 2 times  a day Take prednisone or once a day for 5 days It is important to check your sugar frequently, and you may need to adjust your insulin while on prednisone If your sugar elevates the prednisone Use Chloraseptic spray for throat pain May also take Tylenol or ibuprofen Follow-up with your primary     ED Prescriptions     Medication Sig Dispense Auth. Provider   amoxicillin-clavulanate (AUGMENTIN) 875-125 MG tablet Take 1 tablet by mouth every 12 (twelve) hours. 14 tablet Eustace Moore, MD   predniSONE (DELTASONE) 20 MG tablet Take 2 tablets (40 mg total) by mouth daily with breakfast. 10 tablet Eustace Moore, MD      PDMP not reviewed this encounter.   Eustace Moore, MD 12/14/21 1025

## 2021-12-17 LAB — CULTURE, GROUP A STREP: Strep A Culture: NEGATIVE

## 2022-03-17 ENCOUNTER — Encounter (HOSPITAL_COMMUNITY): Payer: Self-pay

## 2022-03-17 ENCOUNTER — Encounter (HOSPITAL_BASED_OUTPATIENT_CLINIC_OR_DEPARTMENT_OTHER): Payer: Self-pay | Admitting: *Deleted

## 2022-03-17 ENCOUNTER — Emergency Department (HOSPITAL_BASED_OUTPATIENT_CLINIC_OR_DEPARTMENT_OTHER): Payer: Commercial Managed Care - HMO | Admitting: Radiology

## 2022-03-17 ENCOUNTER — Inpatient Hospital Stay (HOSPITAL_BASED_OUTPATIENT_CLINIC_OR_DEPARTMENT_OTHER)
Admission: EM | Admit: 2022-03-17 | Discharge: 2022-03-20 | DRG: 638 | Disposition: A | Discharge status: Home / Self Care | Payer: Commercial Managed Care - HMO | Attending: Internal Medicine | Admitting: Internal Medicine

## 2022-03-17 DIAGNOSIS — E109 Type 1 diabetes mellitus without complications: Secondary | ICD-10-CM

## 2022-03-17 DIAGNOSIS — E860 Dehydration: Secondary | ICD-10-CM | POA: Diagnosis not present

## 2022-03-17 DIAGNOSIS — Z8042 Family history of malignant neoplasm of prostate: Secondary | ICD-10-CM

## 2022-03-17 DIAGNOSIS — Z91030 Bee allergy status: Secondary | ICD-10-CM

## 2022-03-17 DIAGNOSIS — N179 Acute kidney failure, unspecified: Secondary | ICD-10-CM | POA: Diagnosis present

## 2022-03-17 DIAGNOSIS — Z87891 Personal history of nicotine dependence: Secondary | ICD-10-CM | POA: Diagnosis not present

## 2022-03-17 DIAGNOSIS — K219 Gastro-esophageal reflux disease without esophagitis: Secondary | ICD-10-CM | POA: Diagnosis present

## 2022-03-17 DIAGNOSIS — E1110 Type 2 diabetes mellitus with ketoacidosis without coma: Secondary | ICD-10-CM | POA: Diagnosis present

## 2022-03-17 DIAGNOSIS — E876 Hypokalemia: Secondary | ICD-10-CM | POA: Diagnosis not present

## 2022-03-17 DIAGNOSIS — Z888 Allergy status to other drugs, medicaments and biological substances status: Secondary | ICD-10-CM | POA: Diagnosis not present

## 2022-03-17 DIAGNOSIS — E1010 Type 1 diabetes mellitus with ketoacidosis without coma: Principal | ICD-10-CM | POA: Diagnosis present

## 2022-03-17 DIAGNOSIS — I10 Essential (primary) hypertension: Secondary | ICD-10-CM | POA: Diagnosis not present

## 2022-03-17 DIAGNOSIS — F411 Generalized anxiety disorder: Secondary | ICD-10-CM | POA: Diagnosis not present

## 2022-03-17 DIAGNOSIS — Z794 Long term (current) use of insulin: Secondary | ICD-10-CM | POA: Diagnosis not present

## 2022-03-17 DIAGNOSIS — Z809 Family history of malignant neoplasm, unspecified: Secondary | ICD-10-CM | POA: Diagnosis not present

## 2022-03-17 LAB — HEPATIC FUNCTION PANEL
ALT: 16 U/L (ref 0–44)
AST: 14 U/L — ABNORMAL LOW (ref 15–41)
Albumin: 5.2 g/dL — ABNORMAL HIGH (ref 3.5–5.0)
Alkaline Phosphatase: 101 U/L (ref 38–126)
Bilirubin, Direct: 0.1 mg/dL (ref 0.0–0.2)
Indirect Bilirubin: 0.4 mg/dL (ref 0.3–0.9)
Total Bilirubin: 0.5 mg/dL (ref 0.3–1.2)
Total Protein: 8.4 g/dL — ABNORMAL HIGH (ref 6.5–8.1)

## 2022-03-17 LAB — URINALYSIS, ROUTINE W REFLEX MICROSCOPIC
Bilirubin Urine: NEGATIVE
Glucose, UA: >1000 mg/dL — AB
Ketones, ur: >80 mg/dL — AB
Leukocytes,Ua: NEGATIVE
Nitrite: NEGATIVE
Protein, ur: 30 mg/dL — AB
Specific Gravity, Urine: 1.017 (ref 1.005–1.030)
pH: 5 (ref 5.0–8.0)

## 2022-03-17 LAB — CBC
HCT: 51.3 % (ref 39.0–52.0)
Hemoglobin: 18.1 g/dL — ABNORMAL HIGH (ref 13.0–17.0)
MCH: 30.7 pg (ref 26.0–34.0)
MCHC: 35.3 g/dL (ref 30.0–36.0)
MCV: 86.9 fL (ref 80.0–100.0)
Platelets: 478 10*3/uL — ABNORMAL HIGH (ref 150–400)
RBC: 5.9 MIL/uL — ABNORMAL HIGH (ref 4.22–5.81)
RDW: 11.4 % — ABNORMAL LOW (ref 11.5–15.5)
WBC: 17 10*3/uL — ABNORMAL HIGH (ref 4.0–10.5)
nRBC: 0 % (ref 0.0–0.2)

## 2022-03-17 LAB — CBG MONITORING, ED
Glucose-Capillary: 483 mg/dL — ABNORMAL HIGH (ref 70–99)
Glucose-Capillary: 418 mg/dL — ABNORMAL HIGH (ref 70–99)
Glucose-Capillary: 356 mg/dL — ABNORMAL HIGH (ref 70–99)
Glucose-Capillary: 404 mg/dL — ABNORMAL HIGH (ref 70–99)
Glucose-Capillary: 263 mg/dL — ABNORMAL HIGH (ref 70–99)

## 2022-03-17 LAB — BASIC METABOLIC PANEL
Anion gap: 32 — ABNORMAL HIGH (ref 5–15)
BUN: 19 mg/dL (ref 6–20)
CO2: 9 mmol/L — ABNORMAL LOW (ref 22–32)
Calcium: 10.1 mg/dL (ref 8.9–10.3)
Chloride: 87 mmol/L — ABNORMAL LOW (ref 98–111)
Creatinine, Ser: 1.41 mg/dL — ABNORMAL HIGH (ref 0.61–1.24)
GFR, Estimated: >60 mL/min (ref >60)
Glucose, Bld: 482 mg/dL — ABNORMAL HIGH (ref 70–99)
Potassium: 5.4 mmol/L — ABNORMAL HIGH (ref 3.5–5.1)
Sodium: 128 mmol/L — ABNORMAL LOW (ref 135–145)

## 2022-03-17 LAB — I-STAT VENOUS BLOOD GAS, ED
Acid-base deficit: 18 mmol/L — ABNORMAL HIGH (ref 0.0–2.0)
Bicarbonate: 9.3 mmol/L — ABNORMAL LOW (ref 20.0–28.0)
Calcium, Ion: 1.21 mmol/L (ref 1.15–1.40)
HCT: 53 % — ABNORMAL HIGH (ref 39.0–52.0)
Hemoglobin: 18 g/dL — ABNORMAL HIGH (ref 13.0–17.0)
O2 Saturation: 52 %
Patient temperature: 97.9
Potassium: 5.3 mmol/L — ABNORMAL HIGH (ref 3.5–5.1)
Sodium: 126 mmol/L — ABNORMAL LOW (ref 135–145)
TCO2: 10 mmol/L — ABNORMAL LOW (ref 22–32)
pCO2, Ven: 26.3 mmHg — ABNORMAL LOW (ref 44–60)
pH, Ven: 7.157 — CL (ref 7.25–7.43)
pO2, Ven: 34 mmHg (ref 32–45)

## 2022-03-17 MED ORDER — INSULIN REGULAR(HUMAN) IN NACL 100-0.9 UT/100ML-% IV SOLN
INTRAVENOUS | Status: DC
  Administered 2022-03-17: 6.5 [IU]/h via INTRAVENOUS
  Filled 2022-03-17: qty 100

## 2022-03-17 MED ORDER — DEXTROSE 50 % IV SOLN
0.0000 mL | INTRAVENOUS | Status: DC | PRN
Start: 2022-03-17 — End: 2022-03-18

## 2022-03-17 MED ORDER — DEXTROSE IN LACTATED RINGERS 5 % IV SOLN
INTRAVENOUS | Status: DC
  Administered 2022-03-18: 125 mL via INTRAVENOUS

## 2022-03-17 MED ORDER — ONDANSETRON HCL 4 MG/2ML IJ SOLN
4.0000 mg | Freq: Once | INTRAMUSCULAR | Status: AC
  Administered 2022-03-17: 4 mg via INTRAVENOUS
  Filled 2022-03-17: qty 2

## 2022-03-17 MED ORDER — LACTATED RINGERS IV SOLN: INTRAVENOUS | Status: DC

## 2022-03-17 MED ORDER — LACTATED RINGERS IV BOLUS
20.0000 mL/kg | Freq: Once | INTRAVENOUS | Status: AC
  Administered 2022-03-17: 1088 mL via INTRAVENOUS

## 2022-03-17 NOTE — Hospital Course (Addendum)
Robert Villa is a 26 y.o. M with T1DM, HTN, depression who presented with high blood sugars.  In the ER, tachycardic to 146, BG 482, Anion gap 32, VBG showed metabolic acidosis.  Cr 1.4.    Started on fluids, insulin and hospitalists called for admission.

## 2022-03-17 NOTE — Progress Notes (Signed)
Transfer Accept Note  Robert Villa is a 26 y.o. M with T1DM, HTN, depression who presented with high blood sugars.  In the ER, tachycardic to 146, BG 482, Anion gap 32, VBG showed metabolic acidosis.  Cr 1.4.    Started on fluids, insulin and hospitalists called for admission.  No clear precipitant, patient adherent to insulin.  EDP will obtain CXR, UA, LFTs prior to transfer.  To Stepdown at [facility] or Progressive at [facility].

## 2022-03-17 NOTE — ED Triage Notes (Signed)
Pt is type one diabetic and he reports high blood sugar home since 2am but pt has not been able to get it under control.  539 was last cbg at home

## 2022-03-17 NOTE — ED Provider Notes (Signed)
[facility] EMERGENCY DEPT Provider Note   CSN: [id number] Arrival date & time: 03/17/22  1931     History Chief Complaint  Patient presents with   Hyperglycemia    Robert Villa is a 26 y.o. male with h/o T1D and anxiety presents to the ED for evaluation hyperglycemia, with nausea, vomiting, and abdominal pain since earlier this AM.  She reports around 020 0-0 300 this morning, he woke up not feeling very well and had some chills.  He reports he took his blood sugar and it was 288.  He reports he went back to sleep but woke up shortly after and was having some polyuria throughout the night.  He gave himself some more insulin and fell back asleep.  He reports he woke up this morning around 1000-1100 and had around 4-5 episodes of emesis throughout the day.  He reports that he has had some diffuse upper abdominal pain that is typical for him after his emesis.  He denies any vomiting, dysuria, hematuria.  He reports he is felt fatigued over the past month, but no recent illnesses.  He reports that yesterday his blood sugars were on the lower side between 50 and 100.  Denies any new foods or carb heavy meals yesterday.  He reports he has been having more stress in his life with the death of his grandmother and this has been causing his sugars to fluctuate.  Denies any chest pain, shortness of breath, cough or cold symptoms.  He reports he is giving himself around 15 units of 70/30 insulin today over a 10-hour span.  He currently uses sliding scale.  Hyperglycemia Associated symptoms: abdominal pain, nausea, polyuria and vomiting   Associated symptoms: no chest pain, no dysuria, no fever and no shortness of breath        Home Medications Prior to Admission medications   Medication Sig Start Date End Date Taking? Authorizing Provider  ALPRAZolam Duanne Moron) 0.5 MG tablet Take 1 tablet (0.5 mg total) by mouth 2 (two) times daily as needed for anxiety. 11/07/21   Samuel Bouche, NP   amoxicillin-clavulanate (AUGMENTIN) 875-125 MG tablet Take 1 tablet by mouth every 12 (twelve) hours. 12/14/21   Raylene Everts, MD  BD INSULIN SYRINGE U/F 31G X 5/16" 0.3 ML MISC AS DIRECTED FOUR TIMES A DAY WITH INSULIN DX E10.9 90 DAYS 08/05/19   Brunetta Jeans, PA-C  buPROPion (WELLBUTRIN XL) 300 MG 24 hr tablet Take 1 tablet (300 mg total) by mouth daily. 11/07/21   Samuel Bouche, NP  insulin lispro protamine-lispro (HUMALOG 75/25 MIX) (75-25) 100 UNIT/ML SUSP injection Inject 20 units twice daily and sliding scale with meals 08/19/19   Brunetta Jeans, PA-C  lisinopril (ZESTRIL) 10 MG tablet Take 1 tablet (10 mg total) by mouth at bedtime. 11/07/21   Samuel Bouche, NP  pantoprazole (PROTONIX) 20 MG tablet TAKE 1 TABLET BY MOUTH EVERY DAY 10/20/19   Brunetta Jeans, PA-C  predniSONE (DELTASONE) 20 MG tablet Take 2 tablets (40 mg total) by mouth daily with breakfast. 12/14/21   Raylene Everts, MD      Allergies    Bee venom, Claritin [loratadine], and Zoloft [sertraline hcl]    Review of Systems   Review of Systems  Constitutional:  Positive for chills. Negative for fever.  HENT:  Negative for congestion and rhinorrhea.   Respiratory:  Negative for cough and shortness of breath.   Cardiovascular:  Negative for chest pain.  Gastrointestinal:  Positive for abdominal  pain, nausea and vomiting. Negative for constipation and diarrhea.  Endocrine: Positive for polyuria.  Genitourinary:  Negative for dysuria and hematuria.  Neurological:  Negative for headaches.    Physical Exam Updated Vital Signs BP (!) 153/105   Pulse (!) 118   Temp 98.6 F (37 C)   Resp 17   Wt 54.4 kg   SpO2 100%   BMI 18.25 kg/m  Physical Exam Vitals and nursing note reviewed.  Constitutional:      Appearance: He is ill-appearing.  HENT:     Head: Normocephalic and atraumatic.     Mouth/Throat:     Mouth: Mucous membranes are dry.     Comments: Poor dentition Eyes:     General: No scleral  icterus. Cardiovascular:     Rate and Rhythm: Normal rate and regular rhythm.  Pulmonary:     Effort: Pulmonary effort is normal.     Breath sounds: Normal breath sounds.  Abdominal:     General: Bowel sounds are normal. There is no distension.     Palpations: Abdomen is soft.     Tenderness: There is abdominal tenderness. There is no guarding or rebound.     Comments: Diffuse upper abdominal tenderness.  No guarding or rebound.  Normal active bowel sounds.  No overlying skin changes noted.  Musculoskeletal:        General: No deformity.     Cervical back: Normal range of motion.  Skin:    General: Skin is warm and dry.  Neurological:     General: No focal deficit present.     Mental Status: He is alert. Mental status is at baseline.     ED Results / Procedures / Treatments   Labs (all labs ordered are listed, but only abnormal results are displayed) Labs Reviewed  BASIC METABOLIC PANEL - Abnormal; Notable for the following components:      Result Value   Sodium 128 (*)    Potassium 5.4 (*)    Chloride 87 (*)    CO2 9 (*)    Glucose, Bld 482 (*)    Creatinine, Ser 1.41 (*)    Anion gap 32 (*)    All other components within normal limits  CBC - Abnormal; Notable for the following components:   WBC 17.0 (*)    RBC 5.90 (*)    Hemoglobin 18.1 (*)    RDW 11.4 (*)    Platelets 478 (*)    All other components within normal limits  URINALYSIS, ROUTINE W REFLEX MICROSCOPIC - Abnormal; Notable for the following components:   Color, Urine COLORLESS (*)    Glucose, UA >1,000 (*)    Hgb urine dipstick MODERATE (*)    Ketones, ur >80 (*)    Protein, ur 30 (*)    All other components within normal limits  HEPATIC FUNCTION PANEL - Abnormal; Notable for the following components:   Total Protein 8.4 (*)    Albumin 5.2 (*)    AST 14 (*)    All other components within normal limits  CBG MONITORING, ED - Abnormal; Notable for the following components:   Glucose-Capillary 483 (*)     All other components within normal limits  I-STAT VENOUS BLOOD GAS, ED - Abnormal; Notable for the following components:   pH, Ven 7.157 (*)    pCO2, Ven 26.3 (*)    Bicarbonate 9.3 (*)    TCO2 10 (*)    Acid-base deficit 18.0 (*)    Sodium 126 (*)  Potassium 5.3 (*)    HCT 53.0 (*)    Hemoglobin 18.0 (*)    All other components within normal limits  CBG MONITORING, ED - Abnormal; Notable for the following components:   Glucose-Capillary 418 (*)    All other components within normal limits  CBG MONITORING, ED - Abnormal; Notable for the following components:   Glucose-Capillary 404 (*)    All other components within normal limits  CBG MONITORING, ED - Abnormal; Notable for the following components:   Glucose-Capillary 356 (*)    All other components within normal limits  URINE CULTURE  BLOOD GAS, VENOUS  BETA-HYDROXYBUTYRIC ACID  BETA-HYDROXYBUTYRIC ACID    EKG None  Radiology DG Chest Port 1 View  Result Date: 03/17/2022 CLINICAL DATA:  High blood sugar and tachycardia EXAM: PORTABLE CHEST 1 VIEW COMPARISON:  Radiographs 07/02/2016 FINDINGS: No focal consolidation, pleural effusion, or pneumothorax. Normal cardiomediastinal silhouette. No acute osseous abnormality. IMPRESSION: No active disease. Electronically Signed   By: Minerva Fester M.D.   On: 03/17/2022 21:39    Procedures .Critical Care  Performed by: Achille Rich, PA-C Authorized by: Achille Rich, PA-C   Critical care provider statement:    Critical care time (minutes):  30   Critical care was necessary to treat or prevent imminent or life-threatening deterioration of the following conditions:  Endocrine crisis   Critical care was time spent personally by me on the following activities:  Development of treatment plan with patient or surrogate, discussions with consultants, evaluation of patient's response to treatment, examination of patient, ordering and review of laboratory studies, ordering and review of  radiographic studies, ordering and performing treatments and interventions, pulse oximetry, re-evaluation of patient's condition, review of old charts and obtaining history from patient or surrogate   Care discussed with: admitting provider      Medications Ordered in ED Medications  insulin regular, human (MYXREDLIN) 100 units/ 100 mL infusion (6.5 Units/hr Intravenous New Bag/Given 03/17/22 2059)  lactated ringers infusion ( Intravenous New Bag/Given 03/17/22 2143)  dextrose 5 % in lactated ringers infusion (has no administration in time range)  dextrose 50 % solution 0-50 mL (has no administration in time range)  lactated ringers bolus 1,088 mL (0 mLs Intravenous Stopped 03/17/22 2105)    ED Course/ Medical Decision Making/ A&P                           Medical Decision Making Amount and/or Complexity of Data Reviewed Labs: ordered. Radiology: ordered.  Risk Prescription drug management. Decision regarding hospitalization.    26 year old male presents emergency department for evaluation of hyperglycemia and polyuria.  Differential diagnosis includes is limited to DKA, HHS, hyperglycemia, dehydration.  Vital signs show slightly elevated blood pressure 120s over 96, afebrile, tachycardic, mildly tachypneic however satting well room air and speaking in full sentences with ease.  Physical exam as noted above.  On evaluation of the patient, he has dry mucous membranes.  The room smells like ketones.  High suspicion for DKA.  1 L normal saline running.  Labs obtained.  IV started.  I independently reviewed and interpreted the patient's labs.  CBC shows elevated white blood cell count at 17.  Concentrated hemoglobin 18.1.  Elevated platelets of 478 as well.  Could be hemoconcentration due to patient being dehydrated.  On previous chart evaluation, it appears that patient always has an elevated white count when he has DKA.  CBC shows decrease in sodium at 128 although  likely pseudohyponatremia  because his glucose is 482.  Potassium elevated at 5.4.  Bicarb at 9, chloride 87.  Creatinine at 1.14 with baseline around 0.9.  Function panel shows mildly increased albumin and mildly increased total protein.  Mild decreased AST at 14 otherwise no LFT abnormality.  I-STAT VBG shows pH at 7.157.  Urinalysis shows clear colorless urine with greater than thousand glucose.  Moderate amount of hemoglobin greater than 80 ketones.  30 protein present as well.  Beta hydroxybutyric acid in process.  On previous chart evaluation, patient was seen on 02-08-2021 for his hyperglycemia.  Right before that he was seen for uncontrolled type 1 diabetes.  I do not see any additional visits after that.  It appears that he was admitted in 2018 as well as in 2019 for DKA as well.  DKA order set placed with lactated Ringer's, dextrose, and insulin.  Spoke with patient and feel he merits at bedside about admission which they are amenable.  Attending assessed at bedside and agrees with plan and orders.  Spoke with Dr. Maryfrances Bunnell who asked to add on a hepatic panel and chest x-ray.  After review, these appear unremarkable.  I likely think the patient is experiencing his fluctuating sugars due to his emotional stress with his anxiety and his grandmother recently dying.  Patient reports he has been dealing with fluctuating sugars because of this.  Patient requesting [facility] long transfer.  I discussed this case with my attending physician who cosigned this note including patient's presenting symptoms, physical exam, and planned diagnostics and interventions. Attending physician stated agreement with plan or made changes to plan which were implemented.   Attending physician assessed patient at bedside.   Final Clinical Impression(s) / ED Diagnoses Final diagnoses:  Type 1 diabetes mellitus with ketoacidosis without coma [facility])    Rx / DC Orders ED Discharge Orders     None         Achille Rich, PA-C 03/17/22  2235    Pricilla Loveless, MD 03/18/22 1501

## 2022-03-17 NOTE — ED Notes (Signed)
[facility] transport has been called

## 2022-03-17 NOTE — ED Notes (Signed)
RT ran VBG on pt with the following results and criticals. Critical pH 7.157 and HCO3 9.3 reported to MD.    Latest Reference Range & Units 03/17/22 19:55  Sample type  VENOUS  pH, Ven 7.25 - 7.43  7.157 (LL)  pCO2, Ven 44 - 60 mmHg 26.3 (L)  pO2, Ven 32 - 45 mmHg 34  TCO2 22 - 32 mmol/L 10 (L)  Acid-base deficit 0.0 - 2.0 mmol/L 18.0 (H)  Bicarbonate 20.0 - 28.0 mmol/L 9.3 (L)  O2 Saturation % 52  Patient temperature  97.9 F  Collection site  IV start

## 2022-03-18 ENCOUNTER — Other Ambulatory Visit: Payer: Self-pay

## 2022-03-18 DIAGNOSIS — E1110 Type 2 diabetes mellitus with ketoacidosis without coma: Secondary | ICD-10-CM | POA: Diagnosis present

## 2022-03-18 DIAGNOSIS — I10 Essential (primary) hypertension: Secondary | ICD-10-CM | POA: Diagnosis not present

## 2022-03-18 DIAGNOSIS — E1010 Type 1 diabetes mellitus with ketoacidosis without coma: Secondary | ICD-10-CM | POA: Diagnosis not present

## 2022-03-18 DIAGNOSIS — Z888 Allergy status to other drugs, medicaments and biological substances status: Secondary | ICD-10-CM | POA: Diagnosis not present

## 2022-03-18 DIAGNOSIS — F17210 Nicotine dependence, cigarettes, uncomplicated: Secondary | ICD-10-CM | POA: Diagnosis not present

## 2022-03-18 DIAGNOSIS — R102 Pelvic and perineal pain: Secondary | ICD-10-CM | POA: Diagnosis not present

## 2022-03-18 DIAGNOSIS — Z91030 Bee allergy status: Secondary | ICD-10-CM | POA: Diagnosis not present

## 2022-03-18 DIAGNOSIS — R350 Frequency of micturition: Secondary | ICD-10-CM | POA: Diagnosis not present

## 2022-03-18 DIAGNOSIS — N179 Acute kidney failure, unspecified: Secondary | ICD-10-CM | POA: Diagnosis not present

## 2022-03-18 DIAGNOSIS — E876 Hypokalemia: Secondary | ICD-10-CM | POA: Diagnosis not present

## 2022-03-18 DIAGNOSIS — Z8042 Family history of malignant neoplasm of prostate: Secondary | ICD-10-CM | POA: Diagnosis not present

## 2022-03-18 DIAGNOSIS — Z809 Family history of malignant neoplasm, unspecified: Secondary | ICD-10-CM | POA: Diagnosis not present

## 2022-03-18 DIAGNOSIS — F411 Generalized anxiety disorder: Secondary | ICD-10-CM

## 2022-03-18 DIAGNOSIS — Z794 Long term (current) use of insulin: Secondary | ICD-10-CM | POA: Diagnosis not present

## 2022-03-18 DIAGNOSIS — Z87891 Personal history of nicotine dependence: Secondary | ICD-10-CM | POA: Diagnosis not present

## 2022-03-18 DIAGNOSIS — R000 Tachycardia, unspecified: Secondary | ICD-10-CM | POA: Diagnosis not present

## 2022-03-18 DIAGNOSIS — R1030 Lower abdominal pain, unspecified: Secondary | ICD-10-CM | POA: Diagnosis present

## 2022-03-18 DIAGNOSIS — K219 Gastro-esophageal reflux disease without esophagitis: Secondary | ICD-10-CM | POA: Diagnosis not present

## 2022-03-18 DIAGNOSIS — E860 Dehydration: Secondary | ICD-10-CM | POA: Diagnosis not present

## 2022-03-18 LAB — BASIC METABOLIC PANEL
Anion gap: 16 — ABNORMAL HIGH (ref 5–15)
Anion gap: 15 (ref 5–15)
Anion gap: 10 (ref 5–15)
BUN: 16 mg/dL (ref 6–20)
BUN: 10 mg/dL (ref 6–20)
BUN: 8 mg/dL (ref 6–20)
CO2: 14 mmol/L — ABNORMAL LOW (ref 22–32)
CO2: 15 mmol/L — ABNORMAL LOW (ref 22–32)
CO2: 20 mmol/L — ABNORMAL LOW (ref 22–32)
Calcium: 9.1 mg/dL (ref 8.9–10.3)
Calcium: 8.4 mg/dL — ABNORMAL LOW (ref 8.9–10.3)
Calcium: 8.4 mg/dL — ABNORMAL LOW (ref 8.9–10.3)
Chloride: 108 mmol/L (ref 98–111)
Chloride: 105 mmol/L (ref 98–111)
Chloride: 107 mmol/L (ref 98–111)
Creatinine, Ser: 0.96 mg/dL (ref 0.61–1.24)
Creatinine, Ser: 0.72 mg/dL (ref 0.61–1.24)
Creatinine, Ser: 0.65 mg/dL (ref 0.61–1.24)
GFR, Estimated: >60 mL/min (ref >60)
GFR, Estimated: >60 mL/min (ref >60)
GFR, Estimated: >60 mL/min (ref >60)
Glucose, Bld: 169 mg/dL — ABNORMAL HIGH (ref 70–99)
Glucose, Bld: 172 mg/dL — ABNORMAL HIGH (ref 70–99)
Glucose, Bld: 160 mg/dL — ABNORMAL HIGH (ref 70–99)
Potassium: 4.2 mmol/L (ref 3.5–5.1)
Potassium: 3.7 mmol/L (ref 3.5–5.1)
Potassium: 3.4 mmol/L — ABNORMAL LOW (ref 3.5–5.1)
Sodium: 138 mmol/L (ref 135–145)
Sodium: 135 mmol/L (ref 135–145)
Sodium: 137 mmol/L (ref 135–145)

## 2022-03-18 LAB — BETA-HYDROXYBUTYRIC ACID
Beta-Hydroxybutyric Acid: >8 mmol/L — ABNORMAL HIGH (ref 0.05–0.27)
Beta-Hydroxybutyric Acid: 5.98 mmol/L — ABNORMAL HIGH (ref 0.05–0.27)
Beta-Hydroxybutyric Acid: 5.57 mmol/L — ABNORMAL HIGH (ref 0.05–0.27)
Beta-Hydroxybutyric Acid: >8 mmol/L — ABNORMAL HIGH (ref 0.05–0.27)
Beta-Hydroxybutyric Acid: 4.66 mmol/L — ABNORMAL HIGH (ref 0.05–0.27)

## 2022-03-18 LAB — HEMOGLOBIN A1C
Hgb A1c MFr Bld: 11.4 % — ABNORMAL HIGH (ref 4.8–5.6)
Mean Plasma Glucose: 280.48 mg/dL

## 2022-03-18 LAB — BASIC METABOLIC PANEL WITH GFR
Anion gap: 14 (ref 5–15)
BUN: 12 mg/dL (ref 6–20)
CO2: 16 mmol/L — ABNORMAL LOW (ref 22–32)
Calcium: 8.5 mg/dL — ABNORMAL LOW (ref 8.9–10.3)
Chloride: 104 mmol/L (ref 98–111)
Creatinine, Ser: 0.71 mg/dL (ref 0.61–1.24)
GFR, Estimated: >60 mL/min (ref >60)
Glucose, Bld: 115 mg/dL — ABNORMAL HIGH (ref 70–99)
Potassium: 3.6 mmol/L (ref 3.5–5.1)
Sodium: 134 mmol/L — ABNORMAL LOW (ref 135–145)

## 2022-03-18 LAB — GLUCOSE, CAPILLARY
Glucose-Capillary: 110 mg/dL — ABNORMAL HIGH (ref 70–99)
Glucose-Capillary: 114 mg/dL — ABNORMAL HIGH (ref 70–99)
Glucose-Capillary: 121 mg/dL — ABNORMAL HIGH (ref 70–99)
Glucose-Capillary: 125 mg/dL — ABNORMAL HIGH (ref 70–99)
Glucose-Capillary: 125 mg/dL — ABNORMAL HIGH (ref 70–99)
Glucose-Capillary: 148 mg/dL — ABNORMAL HIGH (ref 70–99)
Glucose-Capillary: 151 mg/dL — ABNORMAL HIGH (ref 70–99)
Glucose-Capillary: 157 mg/dL — ABNORMAL HIGH (ref 70–99)
Glucose-Capillary: 161 mg/dL — ABNORMAL HIGH (ref 70–99)
Glucose-Capillary: 168 mg/dL — ABNORMAL HIGH (ref 70–99)
Glucose-Capillary: 169 mg/dL — ABNORMAL HIGH (ref 70–99)
Glucose-Capillary: 174 mg/dL — ABNORMAL HIGH (ref 70–99)
Glucose-Capillary: 188 mg/dL — ABNORMAL HIGH (ref 70–99)
Glucose-Capillary: 188 mg/dL — ABNORMAL HIGH (ref 70–99)
Glucose-Capillary: 189 mg/dL — ABNORMAL HIGH (ref 70–99)
Glucose-Capillary: 190 mg/dL — ABNORMAL HIGH (ref 70–99)
Glucose-Capillary: 192 mg/dL — ABNORMAL HIGH (ref 70–99)
Glucose-Capillary: 194 mg/dL — ABNORMAL HIGH (ref 70–99)
Glucose-Capillary: 194 mg/dL — ABNORMAL HIGH (ref 70–99)
Glucose-Capillary: 204 mg/dL — ABNORMAL HIGH (ref 70–99)
Glucose-Capillary: 223 mg/dL — ABNORMAL HIGH (ref 70–99)

## 2022-03-18 LAB — CBG MONITORING, ED: Glucose-Capillary: 194 mg/dL — ABNORMAL HIGH (ref 70–99)

## 2022-03-18 LAB — HIV ANTIBODY (ROUTINE TESTING W REFLEX): HIV Screen 4th Generation wRfx: NONREACTIVE

## 2022-03-18 LAB — MRSA NEXT GEN BY PCR, NASAL: MRSA by PCR Next Gen: NOT DETECTED

## 2022-03-18 MED ORDER — BUPROPION HCL ER (XL) 150 MG PO TB24
300.0000 mg | ORAL_TABLET | Freq: Every day | ORAL | Status: DC
  Administered 2022-03-18 – 2022-03-20 (×3): 300 mg via ORAL
  Filled 2022-03-18: qty 1
  Filled 2022-03-18: qty 2
  Filled 2022-03-18: qty 1

## 2022-03-18 MED ORDER — ACETAMINOPHEN 325 MG PO TABS
650.0000 mg | ORAL_TABLET | Freq: Four times a day (QID) | ORAL | Status: DC | PRN
  Administered 2022-03-18: 650 mg via ORAL
  Filled 2022-03-18: qty 2

## 2022-03-18 MED ORDER — DEXTROSE 50 % IV SOLN: 0.0000 mL | INTRAVENOUS | Status: DC | PRN

## 2022-03-18 MED ORDER — NICOTINE 14 MG/24HR TD PT24
14.0000 mg | MEDICATED_PATCH | Freq: Every day | TRANSDERMAL | Status: DC
  Administered 2022-03-18 – 2022-03-19 (×2): 14 mg via TRANSDERMAL
  Filled 2022-03-18 (×3): qty 1

## 2022-03-18 MED ORDER — DEXTROSE IN LACTATED RINGERS 5 % IV SOLN: INTRAVENOUS | Status: DC

## 2022-03-18 MED ORDER — PANTOPRAZOLE SODIUM 20 MG PO TBEC
20.0000 mg | DELAYED_RELEASE_TABLET | Freq: Every day | ORAL | Status: DC
  Administered 2022-03-19 – 2022-03-20 (×2): 20 mg via ORAL
  Filled 2022-03-18 (×3): qty 1

## 2022-03-18 MED ORDER — ORAL CARE MOUTH RINSE: 15.0000 mL | OROMUCOSAL | Status: DC | PRN

## 2022-03-18 MED ORDER — LACTATED RINGERS IV BOLUS
500.0000 mL | Freq: Once | INTRAVENOUS | Status: AC
  Administered 2022-03-18: 500 mL via INTRAVENOUS

## 2022-03-18 MED ORDER — ONDANSETRON HCL 4 MG/2ML IJ SOLN: 4.0000 mg | Freq: Four times a day (QID) | INTRAMUSCULAR | Status: DC | PRN

## 2022-03-18 MED ORDER — ALPRAZOLAM 0.5 MG PO TABS: 0.5000 mg | ORAL_TABLET | Freq: Two times a day (BID) | ORAL | Status: DC | PRN

## 2022-03-18 MED ORDER — MELATONIN 5 MG PO TABS
10.0000 mg | ORAL_TABLET | Freq: Every evening | ORAL | Status: DC | PRN
  Administered 2022-03-19: 10 mg via ORAL
  Filled 2022-03-18: qty 2

## 2022-03-18 MED ORDER — LACTATED RINGERS IV SOLN: INTRAVENOUS | Status: DC

## 2022-03-18 MED ORDER — ENOXAPARIN SODIUM 40 MG/0.4ML IJ SOSY
40.0000 mg | PREFILLED_SYRINGE | Freq: Every day | INTRAMUSCULAR | Status: DC
  Filled 2022-03-18 (×2): qty 0.4

## 2022-03-18 MED ORDER — CHLORHEXIDINE GLUCONATE CLOTH 2 % EX PADS
6.0000 | MEDICATED_PAD | Freq: Every day | CUTANEOUS | Status: DC
  Administered 2022-03-18 (×2): 6 via TOPICAL

## 2022-03-18 NOTE — H&P (Signed)
History and Physical    Robert Villa EAV:[id number] DOB: 10/05/1995 DOA: 03/17/2022  DOS: the patient was seen and examined on 03/17/2022  PCP: Christen Butter, NP   Patient coming from: Home  I have personally briefly reviewed patient's old medical records in [facility]  CC: N/V HPI: 26 year old Caucasian male history of type 1 diabetes since age 53, history of hypertension, reflux presents to the ER today with a 1 day history of nausea vomiting.  He started having abdominal pain this morning along with the nausea.  Patient states that he had a switch from Humalog 75/25 insulin to 70/30 insulin due to insurance reasons.  He states his insurance no longer was paying for humalog 75/25.  He has never been on insulin pump before.  He has been seen by endocrinology before but did not like the endocrinologist and never went back.  Primary care physician has been managing his diabetes.  Patient was using his 7525 and a sliding scale fashion.  This was even commented on by his endocrinologist that this was an improper way to manage his diabetes.  Patient has been on basal bolus insulin before unclear why he switched over to mixed insulin.  On arrival to ER, temp 97.9 heart rate 146 blood pressure 120/104.  Labs showed a white count of 17, hemoglobin of 18.1, platelets of 471  VBG showed a pH of 7.15, PCO2 of 26, PO2 34  Sodium 128, potassium 5.4, bicarb of 9, BUN of 19, creatinine 1.4 glucose of 482  Patient started insulin drip and IV fluids.  He was given 20 cc/kg IV fluid bolus.  [facility] hospitalist contacted for admission.   ED Course: pH 7.15, serum bicarb of 9, glucose greater than 400.  Review of Systems:  Review of Systems  Constitutional: Negative.   HENT: Negative.    Eyes: Negative.   Respiratory: Negative.    Cardiovascular: Negative.   Gastrointestinal:  Positive for nausea and vomiting.  Genitourinary: Negative.   Musculoskeletal: Negative.   Skin: Negative.    Neurological: Negative.   Endo/Heme/Allergies:        Hyperglycemia  Psychiatric/Behavioral: Negative.    All other systems reviewed and are negative.   Past Medical History:  Diagnosis Date   Anxiety    Diabetes mellitus    Essential hypertension    GERD (gastroesophageal reflux disease) 3 years   Mental disorder     Past Surgical History:  Procedure Laterality Date   ANKLE SURGERY     FRACTURE SURGERY     NOSE SURGERY       reports that he has been smoking cigarettes. He has a 0.11 pack-year smoking history. He has never used smokeless tobacco. He reports that he does not currently use alcohol. He reports that he does not currently use drugs after having used the following drugs: Marijuana.  Allergies  Allergen Reactions   Bee Venom Anaphylaxis   Claritin [Loratadine]     Nose bleeds   Zoloft [Sertraline Hcl]     Heavy Sweats & Jittery    Family History  Problem Relation Age of Onset   Healthy Mother    Cancer Father        Prostate   Healthy Sister    Cancer Maternal Grandmother     Prior to Admission medications   Medication Sig Start Date End Date Taking? Authorizing Provider  ALPRAZolam Prudy Feeler) 0.5 MG tablet Take 1 tablet (0.5 mg total) by mouth 2 (two) times daily as needed for  anxiety. 11/07/21   Samuel Bouche, NP  amoxicillin-clavulanate (AUGMENTIN) 875-125 MG tablet Take 1 tablet by mouth every 12 (twelve) hours. 12/14/21   Raylene Everts, MD  BD INSULIN SYRINGE U/F 31G X 5/16" 0.3 ML MISC AS DIRECTED FOUR TIMES A DAY WITH INSULIN DX E10.9 90 DAYS 08/05/19   Brunetta Jeans, PA-C  buPROPion (WELLBUTRIN XL) 300 MG 24 hr tablet Take 1 tablet (300 mg total) by mouth daily. 11/07/21   Samuel Bouche, NP  insulin lispro protamine-lispro (HUMALOG 75/25 MIX) (75-25) 100 UNIT/ML SUSP injection Inject 20 units twice daily and sliding scale with meals 08/19/19   Brunetta Jeans, PA-C  lisinopril (ZESTRIL) 10 MG tablet Take 1 tablet (10 mg total) by mouth at bedtime.  11/07/21   Samuel Bouche, NP  pantoprazole (PROTONIX) 20 MG tablet TAKE 1 TABLET BY MOUTH EVERY DAY 10/20/19   Brunetta Jeans, PA-C  predniSONE (DELTASONE) 20 MG tablet Take 2 tablets (40 mg total) by mouth daily with breakfast. 12/14/21   Raylene Everts, MD    Physical Exam: Vitals:   03/17/22 2315 03/18/22 0030 03/18/22 0100 03/18/22 0225  BP: (!) 128/96 (!) 116/90 117/87 (!) 136/106  Pulse: (!) 119 (!) 111 (!) 117 (!) 109  Resp: (!) 28 19  (!) 31  Temp:    98.2 F (36.8 C)  TempSrc:    Oral  SpO2: 100% 100% 100% 100%  Weight:    54.4 kg  Height:    5\' 8"  (1.727 m)    Physical Exam Vitals and nursing note reviewed.  Constitutional:      General: He is not in acute distress.    Appearance: Normal appearance. He is not ill-appearing, toxic-appearing or diaphoretic.     Comments: thin  HENT:     Head: Normocephalic and atraumatic.     Nose: Nose normal.  Eyes:     General: No scleral icterus. Cardiovascular:     Rate and Rhythm: Regular rhythm. Tachycardia present.     Pulses: Normal pulses.  Pulmonary:     Effort: Pulmonary effort is normal. No respiratory distress.     Breath sounds: Normal breath sounds. No wheezing or rales.  Abdominal:     General: Abdomen is flat. Bowel sounds are normal. There is no distension.     Palpations: Abdomen is soft.     Tenderness: There is no abdominal tenderness. There is no guarding or rebound.  Musculoskeletal:     Right lower leg: No edema.     Left lower leg: No edema.  Skin:    General: Skin is warm and dry.     Capillary Refill: Capillary refill takes less than 2 seconds.  Neurological:     General: No focal deficit present.     Mental Status: He is alert and oriented to person, place, and time.      Labs on Admission: I have personally reviewed following labs and imaging studies  CBC: Recent Labs  Lab 03/17/22 1947 03/17/22 1955  WBC 17.0*  --   HGB 18.1* 18.0*  HCT 51.3 53.0*  MCV 86.9  --   PLT 478*  --     Basic Metabolic Panel: Recent Labs  Lab 03/17/22 1947 03/17/22 1955  NA 128* 126*  K 5.4* 5.3*  CL 87*  --   CO2 9*  --   GLUCOSE 482*  --   BUN 19  --   CREATININE 1.41*  --   CALCIUM 10.1  --  GFR: Estimated Creatinine Clearance: 60 mL/min (A) (by C-G formula based on SCr of 1.41 mg/dL (H)). Liver Function Tests: Recent Labs  Lab 03/17/22 1947  AST 14*  ALT 16  ALKPHOS 101  BILITOT 0.5  PROT 8.4*  ALBUMIN 5.2*   No results for input(s): "LIPASE", "AMYLASE" in the last 168 hours. No results for input(s): "AMMONIA" in the last 168 hours. Coagulation Profile: No results for input(s): "INR", "PROTIME" in the last 168 hours. Cardiac Enzymes: No results for input(s): "CKTOTAL", "CKMB", "CKMBINDEX", "TROPONINI", "TROPONINIHS" in the last 168 hours. BNP (last 3 results) No results for input(s): "PROBNP" in the last 8760 hours. HbA1C: No results for input(s): "HGBA1C" in the last 72 hours. CBG: Recent Labs  Lab 03/17/22 2219 03/17/22 2329 03/18/22 0036 03/18/22 0204 03/18/22 0306  GLUCAP 356* 263* 194* 194* 168*   Lipid Profile: No results for input(s): "CHOL", "HDL", "LDLCALC", "TRIG", "CHOLHDL", "LDLDIRECT" in the last 72 hours. Thyroid Function Tests: No results for input(s): "TSH", "T4TOTAL", "FREET4", "T3FREE", "THYROIDAB" in the last 72 hours. Anemia Panel: No results for input(s): "VITAMINB12", "FOLATE", "FERRITIN", "TIBC", "IRON", "RETICCTPCT" in the last 72 hours. Urine analysis:    Component Value Date/Time   COLORURINE COLORLESS (A) 03/17/2022 1947   APPEARANCEUR CLEAR 03/17/2022 1947   LABSPEC 1.017 03/17/2022 1947   PHURINE 5.0 03/17/2022 1947   GLUCOSEU >1,000 (A) 03/17/2022 1947   HGBUR MODERATE (A) 03/17/2022 1947   BILIRUBINUR NEGATIVE 03/17/2022 1947   KETONESUR >80 (A) 03/17/2022 1947   PROTEINUR 30 (A) 03/17/2022 1947   UROBILINOGEN 0.2 11/13/2011 1303   NITRITE NEGATIVE 03/17/2022 1947   LEUKOCYTESUR NEGATIVE 03/17/2022 1947     Radiological Exams on Admission: I have personally reviewed images DG Chest Port 1 View  Result Date: 03/17/2022 CLINICAL DATA:  High blood sugar and tachycardia EXAM: PORTABLE CHEST 1 VIEW COMPARISON:  Radiographs 07/02/2016 FINDINGS: No focal consolidation, pleural effusion, or pneumothorax. Normal cardiomediastinal silhouette. No acute osseous abnormality. IMPRESSION: No active disease. Electronically Signed   By: Minerva Fester M.D.   On: 03/17/2022 21:39    EKG: My personal interpretation of EKG shows: no EKG    Assessment/Plan Principal Problem:   DKA, type 1 (HCC) Active Problems:   AKI (acute kidney injury) (HCC)   Essential hypertension   Generalized anxiety disorder    Assessment and Plan: * DKA, type 1 (HCC) Observation stepdown unit bed with telemetry.  Continue with insulin drip protocol for DKA.  BMP to 4 hours.  Accu-Cheks every hour.  When he is able to come off insulin drip, please start basal bolus insulin.  With Humalog and Lantus.  Both the patient and his mother recall the patient being on basal bolus insulin in the past.  Patient states that he did very well with this regimen in the past.  She is also interested in continuous glucose meter such as a Dexcom system.  Patient prefers using his insulin in the form of vial and needle rather than a pen.  Only prescriptions for vial Lantus insulin, vial Humalog insulin, insulin needles, Dexcom system.  AKI (acute kidney injury) (HCC) Likely due to his dehydration, nausea vomiting.  Continue IV fluids.  Hold lisinopril for now.  Generalized anxiety disorder Stable.  Continue Wellbutrin and as needed Xanax.  Essential hypertension Stable.  Due to his acute kidney injury, hold lisinopril.   DVT prophylaxis: SCDs Code Status: Full Code Family Communication: discussed with pt and his mother at bedside  Disposition Plan: return home  Consults called:  none  Admission status: Observation, Step Down Unit   Carollee Herter, DO [facility] 03/18/2022, 3:10 AM

## 2022-03-18 NOTE — Assessment & Plan Note (Signed)
Stable.  Due to his acute kidney injury, hold lisinopril.

## 2022-03-18 NOTE — Assessment & Plan Note (Signed)
Observation stepdown unit bed with telemetry.  Continue with insulin drip protocol for DKA.  BMP to 4 hours.  Accu-Cheks every hour.  When he is able to come off insulin drip, please start basal bolus insulin.  With Humalog and Lantus.  Both the patient and his mother recall the patient being on basal bolus insulin in the past.  Patient states that he did very well with this regimen in the past.  She is also interested in continuous glucose meter such as a Dexcom system.  Patient prefers using his insulin in the form of vial and needle rather than a pen.  Only prescriptions for vial Lantus insulin, vial Humalog insulin, insulin needles, Dexcom system.

## 2022-03-18 NOTE — Inpatient Diabetes Management (Signed)
Inpatient Diabetes Program Recommendations  AACE/ADA: New Consensus Statement on Inpatient Glycemic Control (2015)  Target Ranges:  Prepandial:   less than 140 mg/dL      Peak postprandial:   less than 180 mg/dL (1-2 hours)      Critically ill patients:  140 - 180 mg/dL   Lab Results  Component Value Date   GLUCAP 114 (H) 03/18/2022   HGBA1C 10.9 (A) 11/07/2021    Review of Glycemic Control  Latest Reference Range & Units 03/18/22 06:46 03/18/22 08:09 03/18/22 08:56  Glucose-Capillary 70 - 99 mg/dL 125 (H) 125 (H) 114 (H)  (H): Data is abnormally high Diabetes history: Type 1 Dm Outpatient Diabetes medications: Novolog 70/30 20 units BID Current orders for Inpatient glycemic control: IV insulin  Inpatient Diabetes Program Recommendations:    When ready to transition consider: -Levemir 20 units two hours prior to discontinuing IV insulin, then QD to follow -Novolog 0-9 units TID & HS -Novolog 3 units TID  A1C in process. Attempted to contact patient, no answer. Will reattempt at a later time. At discharge patient requesting vial/syringe: -Levemir #[id number] - Humalog #[id number]  Thanks, Bronson Curb, MSN, RNC-OB Diabetes Coordinator [phone] (8a-5p)

## 2022-03-18 NOTE — Assessment & Plan Note (Signed)
Stable.  Continue Wellbutrin and as needed Xanax.

## 2022-03-18 NOTE — Progress Notes (Signed)
[facility] PROGRESS NOTE   HILLEL CARD DVV:[id number] DOB: 05/22/1996 DOA: 03/17/2022  PCP: Christen Butter, NP  Brief History/Interval Summary: 26 year old Caucasian male with history of type 1 diabetes since age 14, history of hypertension, reflux presented with 1 day history of nausea and vomiting and abdominal discomfort.  Had a recent switch from his Humalog 75/25 insulin to 70/30 insulin due to insurance reasons.  Was noted to have diabetic ketoacidosis.  Hospitalized for further management.  Denies noncompliance with his insulin regimen.  No infectious etiology has been found.  Consultants: None  Procedures: None    Subjective/Interval History: Patient mentions that he is feeling better this morning.  Nausea and vomiting has subsided.  Denies any abdominal pain.  Still sore from all the vomiting episodes over the last 24 hours.  No shortness of breath.  No dysuria.  No joint pains or skin rashes.    Assessment/Plan:  Diabetic ketoacidosis in the setting of diabetes mellitus type 1, uncontrolled with hyperglycemia Anion gap appears to be closing gradually.  Wait for the next metabolic panel before transitioning him to basal/bolus insulin.  Beta hydroxybutyrate acid levels have been improving.   Patient wants to transition from the 70/30 insulin to Lantus and NovoLog versus Humalog.  He says that his insurance will cover these medications. HbA1c is pending.  Continue IV fluids. No precipitating factor identified for his DKA.  Elevated WBC likely reactive versus secondary to hemoconcentration.  Will recheck labs tomorrow.   UA was negative for infection.  Chest x-ray was unremarkable as well.  Change in insulin could be responsible.  Patient denies noncompliance.  Acute kidney injury Likely due to dehydration from nausea and vomiting.  Improved with IV hydration.  ACE inhibitor on hold.  Essential hypertension ACE inhibitor on hold.  Blood pressure is reasonably well  controlled.  Generalized anxiety disorder Continue Wellbutrin.  DVT Prophylaxis: Initiate Lovenox Code Status: Full code Family Communication: Discussed with the patient Disposition Plan: Hopefully return home when improved.  Start mobilizing  Status is: Inpatient Remains inpatient appropriate because: DKA      Medications: Scheduled:  Chlorhexidine Gluconate Cloth  6 each Topical Daily   pantoprazole  20 mg Oral Daily   Continuous:  dextrose 5% lactated ringers 125 mL (03/18/22 0952)   insulin 0.3 Units/hr (03/18/22 1003)   lactated ringers Stopped (03/18/22 0040)   GYI:RSWNIOEVOJJKK, ALPRAZolam, dextrose, melatonin, ondansetron (ZOFRAN) IV, mouth rinse  Antibiotics: Anti-infectives (From admission, onward)    None       Objective:  Vital Signs  Vitals:   03/18/22 0030 03/18/22 0100 03/18/22 0225 03/18/22 0906  BP: (!) 116/90 117/87 (!) 136/106   Pulse: (!) 111 (!) 117 (!) 109   Resp: 19  (!) 31   Temp:   98.2 F (36.8 C) 98.3 F (36.8 C)  TempSrc:   Oral Oral  SpO2: 100% 100% 100%   Weight:   54.4 kg   Height:   5\' 8"  (1.727 m)     Intake/Output Summary (Last 24 hours) at 03/18/2022 1032 Last data filed at 03/18/2022 0811 Gross per 24 hour  Intake 1998.46 ml  Output --  Net 1998.46 ml   Filed Weights   03/17/22 1940 03/18/22 0225  Weight: 54.4 kg 54.4 kg    General appearance: Awake alert.  In no distress Resp: Clear to auscultation bilaterally.  Normal effort Cardio: S1-S2 is normal regular.  No S3-S4.  No rubs murmurs or bruit GI: Abdomen is soft.  Nontender  nondistended.  Bowel sounds are present normal.  No masses organomegaly Extremities: No edema.  Full range of motion of lower extremities. Neurologic: Alert and oriented x3.  No focal neurological deficits.    Lab Results:  Data Reviewed: I have personally reviewed following labs and reports of the imaging studies  CBC: Recent Labs  Lab 03/17/22 1947 03/17/22 1955  WBC 17.0*  --    HGB 18.1* 18.0*  HCT 51.3 53.0*  MCV 86.9  --   PLT 478*  --     Basic Metabolic Panel: Recent Labs  Lab 03/17/22 1947 03/17/22 1955 03/18/22 0246 03/18/22 0822  NA 128* 126* 138 134*  K 5.4* 5.3* 4.2 3.6  CL 87*  --  108 104  CO2 9*  --  14* 16*  GLUCOSE 482*  --  169* 115*  BUN 19  --  16 12  CREATININE 1.41*  --  0.96 0.71  CALCIUM 10.1  --  9.1 8.5*    GFR: Estimated Creatinine Clearance: 105.8 mL/min (by C-G formula based on SCr of 0.71 mg/dL).  Liver Function Tests: Recent Labs  Lab 03/17/22 1947  AST 14*  ALT 16  ALKPHOS 101  BILITOT 0.5  PROT 8.4*  ALBUMIN 5.2*     CBG: Recent Labs  Lab 03/18/22 0521 03/18/22 0646 03/18/22 0809 03/18/22 0856 03/18/22 1001  GLUCAP 148* 125* 125* 114* 110*     Recent Results (from the past 240 hour(s))  MRSA Next Gen by PCR, Nasal     Status: None   Collection Time: 03/18/22  2:49 AM   Specimen: Nasal Mucosa; Nasal Swab  Result Value Ref Range Status   MRSA by PCR Next Gen NOT DETECTED NOT DETECTED Final    Comment: (NOTE) The GeneXpert MRSA Assay (FDA approved for NASAL specimens only), is one component of a comprehensive MRSA colonization surveillance program. It is not intended to diagnose MRSA infection nor to guide or monitor treatment for MRSA infections. Test performance is not FDA approved in patients less than 44 years old. Performed at [facility], [place] [address] [place], [place] [address]       Radiology Studies: DG Chest Port 1 View  Result Date: 03/17/2022 CLINICAL DATA:  High blood sugar and tachycardia EXAM: PORTABLE CHEST 1 VIEW COMPARISON:  Radiographs 07/02/2016 FINDINGS: No focal consolidation, pleural effusion, or pneumothorax. Normal cardiomediastinal silhouette. No acute osseous abnormality. IMPRESSION: No active disease. Electronically Signed   By: Placido Sou M.D.   On: 03/17/2022 21:39       LOS: 0 days   [facility] Pager  on [email or web address]  03/18/2022, 10:32 AM

## 2022-03-18 NOTE — Assessment & Plan Note (Signed)
Likely due to his dehydration, nausea vomiting.  Continue IV fluids.  Hold lisinopril for now.

## 2022-03-18 NOTE — Subjective & Objective (Signed)
CC: N/V HPI: 26 year old Caucasian male history of type 1 diabetes since age 85, history of hypertension, reflux presents to the ER today with a 1 day history of nausea vomiting.  He started having abdominal pain this morning along with the nausea.  Patient states that he had a switch from Humalog 75/25 insulin to 70/30 insulin due to insurance reasons.  He states his insurance no longer was paying for humalog 75/25.  He has never been on insulin pump before.  He has been seen by endocrinology before but did not like the endocrinologist and never went back.  Primary care physician has been managing his diabetes.  Patient was using his 7525 and a sliding scale fashion.  This was even commented on by his endocrinologist that this was an improper way to manage his diabetes.  Patient has been on basal bolus insulin before unclear why he switched over to mixed insulin.  On arrival to ER, temp 97.9 heart rate 146 blood pressure 120/104.  Labs showed a white count of 17, hemoglobin of 18.1, platelets of 471  VBG showed a pH of 7.15, PCO2 of 26, PO2 34  Sodium 128, potassium 5.4, bicarb of 9, BUN of 19, creatinine 1.4 glucose of 482  Patient started insulin drip and IV fluids.  He was given 20 cc/kg IV fluid bolus.  [facility] hospitalist contacted for admission.

## 2022-03-19 DIAGNOSIS — N179 Acute kidney failure, unspecified: Secondary | ICD-10-CM | POA: Diagnosis present

## 2022-03-19 DIAGNOSIS — E860 Dehydration: Secondary | ICD-10-CM | POA: Diagnosis present

## 2022-03-19 DIAGNOSIS — Z809 Family history of malignant neoplasm, unspecified: Secondary | ICD-10-CM | POA: Diagnosis not present

## 2022-03-19 DIAGNOSIS — R000 Tachycardia, unspecified: Secondary | ICD-10-CM | POA: Diagnosis not present

## 2022-03-19 DIAGNOSIS — E1010 Type 1 diabetes mellitus with ketoacidosis without coma: Secondary | ICD-10-CM | POA: Diagnosis present

## 2022-03-19 DIAGNOSIS — I10 Essential (primary) hypertension: Secondary | ICD-10-CM | POA: Diagnosis present

## 2022-03-19 DIAGNOSIS — Z794 Long term (current) use of insulin: Secondary | ICD-10-CM | POA: Diagnosis not present

## 2022-03-19 DIAGNOSIS — K219 Gastro-esophageal reflux disease without esophagitis: Secondary | ICD-10-CM | POA: Diagnosis present

## 2022-03-19 DIAGNOSIS — R350 Frequency of micturition: Secondary | ICD-10-CM | POA: Diagnosis not present

## 2022-03-19 DIAGNOSIS — E1110 Type 2 diabetes mellitus with ketoacidosis without coma: Secondary | ICD-10-CM | POA: Diagnosis present

## 2022-03-19 DIAGNOSIS — R1030 Lower abdominal pain, unspecified: Secondary | ICD-10-CM | POA: Diagnosis present

## 2022-03-19 DIAGNOSIS — Z87891 Personal history of nicotine dependence: Secondary | ICD-10-CM | POA: Diagnosis not present

## 2022-03-19 DIAGNOSIS — F411 Generalized anxiety disorder: Secondary | ICD-10-CM | POA: Diagnosis present

## 2022-03-19 DIAGNOSIS — E876 Hypokalemia: Secondary | ICD-10-CM

## 2022-03-19 DIAGNOSIS — F17210 Nicotine dependence, cigarettes, uncomplicated: Secondary | ICD-10-CM | POA: Diagnosis not present

## 2022-03-19 DIAGNOSIS — R102 Pelvic and perineal pain: Secondary | ICD-10-CM | POA: Diagnosis not present

## 2022-03-19 DIAGNOSIS — Z91030 Bee allergy status: Secondary | ICD-10-CM | POA: Diagnosis not present

## 2022-03-19 DIAGNOSIS — Z888 Allergy status to other drugs, medicaments and biological substances status: Secondary | ICD-10-CM | POA: Diagnosis not present

## 2022-03-19 DIAGNOSIS — Z8042 Family history of malignant neoplasm of prostate: Secondary | ICD-10-CM | POA: Diagnosis not present

## 2022-03-19 LAB — GLUCOSE, CAPILLARY
Glucose-Capillary: 122 mg/dL — ABNORMAL HIGH (ref 70–99)
Glucose-Capillary: 143 mg/dL — ABNORMAL HIGH (ref 70–99)
Glucose-Capillary: 165 mg/dL — ABNORMAL HIGH (ref 70–99)
Glucose-Capillary: 169 mg/dL — ABNORMAL HIGH (ref 70–99)
Glucose-Capillary: 180 mg/dL — ABNORMAL HIGH (ref 70–99)
Glucose-Capillary: 181 mg/dL — ABNORMAL HIGH (ref 70–99)
Glucose-Capillary: 199 mg/dL — ABNORMAL HIGH (ref 70–99)
Glucose-Capillary: 213 mg/dL — ABNORMAL HIGH (ref 70–99)
Glucose-Capillary: 216 mg/dL — ABNORMAL HIGH (ref 70–99)
Glucose-Capillary: 254 mg/dL — ABNORMAL HIGH (ref 70–99)

## 2022-03-19 LAB — CBC
HCT: 41.4 % (ref 39.0–52.0)
Hemoglobin: 14.5 g/dL (ref 13.0–17.0)
MCH: 30.4 pg (ref 26.0–34.0)
MCHC: 35 g/dL (ref 30.0–36.0)
MCV: 86.8 fL (ref 80.0–100.0)
Platelets: 273 10*3/uL (ref 150–400)
RBC: 4.77 MIL/uL (ref 4.22–5.81)
RDW: 11.7 % (ref 11.5–15.5)
WBC: 8.6 10*3/uL (ref 4.0–10.5)
nRBC: 0 % (ref 0.0–0.2)

## 2022-03-19 LAB — URINE CULTURE: Culture: NO GROWTH

## 2022-03-19 LAB — BASIC METABOLIC PANEL
Anion gap: 7 (ref 5–15)
BUN: <5 mg/dL — ABNORMAL LOW (ref 6–20)
CO2: 24 mmol/L (ref 22–32)
Calcium: 8.2 mg/dL — ABNORMAL LOW (ref 8.9–10.3)
Chloride: 105 mmol/L (ref 98–111)
Creatinine, Ser: 0.52 mg/dL — ABNORMAL LOW (ref 0.61–1.24)
GFR, Estimated: >60 mL/min (ref >60)
Glucose, Bld: 155 mg/dL — ABNORMAL HIGH (ref 70–99)
Potassium: 2.8 mmol/L — ABNORMAL LOW (ref 3.5–5.1)
Sodium: 136 mmol/L (ref 135–145)

## 2022-03-19 LAB — MAGNESIUM: Magnesium: 1.5 mg/dL — ABNORMAL LOW (ref 1.7–2.4)

## 2022-03-19 MED ORDER — POTASSIUM CHLORIDE CRYS ER 20 MEQ PO TBCR
40.0000 meq | EXTENDED_RELEASE_TABLET | Freq: Once | ORAL | Status: AC
  Administered 2022-03-19: 40 meq via ORAL
  Filled 2022-03-19: qty 2

## 2022-03-19 MED ORDER — INSULIN GLARGINE-YFGN 100 UNIT/ML ~~LOC~~ SOLN
20.0000 [IU] | Freq: Every day | SUBCUTANEOUS | Status: DC
  Administered 2022-03-19 – 2022-03-20 (×2): 20 [IU] via SUBCUTANEOUS
  Filled 2022-03-19 (×2): qty 0.2

## 2022-03-19 MED ORDER — MAGNESIUM SULFATE 4 GM/100ML IV SOLN
4.0000 g | Freq: Once | INTRAVENOUS | Status: AC
  Administered 2022-03-19: 4 g via INTRAVENOUS
  Filled 2022-03-19: qty 100

## 2022-03-19 MED ORDER — INSULIN ASPART 100 UNIT/ML IJ SOLN
0.0000 [IU] | Freq: Three times a day (TID) | INTRAMUSCULAR | Status: DC
  Administered 2022-03-19: 5 [IU] via SUBCUTANEOUS
  Administered 2022-03-19: 2 [IU] via SUBCUTANEOUS
  Administered 2022-03-20: 3 [IU] via SUBCUTANEOUS

## 2022-03-19 MED ORDER — KCL-LACTATED RINGERS-D5W 20 MEQ/L IV SOLN
INTRAVENOUS | Status: DC
  Filled 2022-03-19: qty 1000

## 2022-03-19 MED ORDER — POTASSIUM CHLORIDE CRYS ER 20 MEQ PO TBCR
40.0000 meq | EXTENDED_RELEASE_TABLET | ORAL | Status: AC
  Administered 2022-03-19 (×2): 40 meq via ORAL
  Filled 2022-03-19 (×2): qty 2

## 2022-03-19 MED ORDER — INSULIN ASPART 100 UNIT/ML IJ SOLN: 0.0000 [IU] | Freq: Every day | INTRAMUSCULAR | Status: DC

## 2022-03-19 MED ORDER — POTASSIUM CHLORIDE 10 MEQ/100ML IV SOLN
10.0000 meq | INTRAVENOUS | Status: DC
  Administered 2022-03-19: 10 meq via INTRAVENOUS
  Filled 2022-03-19: qty 100

## 2022-03-19 MED ORDER — LISINOPRIL 10 MG PO TABS
10.0000 mg | ORAL_TABLET | Freq: Every day | ORAL | Status: DC
  Administered 2022-03-19: 10 mg via ORAL
  Filled 2022-03-19: qty 1

## 2022-03-19 NOTE — Inpatient Diabetes Management (Signed)
Inpatient Diabetes Program Recommendations  AACE/ADA: New Consensus Statement on Inpatient Glycemic Control (2015)  Target Ranges:  Prepandial:   less than 140 mg/dL      Peak postprandial:   less than 180 mg/dL (1-2 hours)      Critically ill patients:  140 - 180 mg/dL   Lab Results  Component Value Date   GLUCAP 254 (H) 03/19/2022   HGBA1C 11.4 (H) 03/18/2022    Review of Glycemic Control  Latest Reference Range & Units 03/19/22 06:49 03/19/22 09:06  Glucose-Capillary 70 - 99 mg/dL 181 (H) 254 (H)  (H): Data is abnormally high  Latest Reference Range & Units 03/19/22 03:48  Potassium 3.5 - 5.1 mmol/L 2.8 (L)  (L): Data is abnormally low Diabetes history: Type 1 Dm Outpatient Diabetes medications: Novolog 70/30 20 units BID Current orders for Inpatient glycemic control: IV insulin   Inpatient Diabetes Program Recommendations:    Noted K 2.8 and patient refusing IV K replacement.  Recommend discontinuing IV insulin and plan to transition. consider: -Levemir 20 units one hours prior to discontinuing IV insulin, then QD to follow -Novolog 0-9 units TID & HS -Novolog 3 units TID   Secure chat sent to RN & MD.  At discharge patient requesting vial/syringe: -Levemir #[id number] - Humalog #[id number] Will see 10/2.  Thanks, Bronson Curb, MSN, RNC-OB Diabetes Coordinator [phone] (8a-5p)

## 2022-03-19 NOTE — Progress Notes (Signed)
[facility] PROGRESS NOTE   Robert Villa XHB:[id number] DOB: 02/16/1996 DOA: 03/17/2022  PCP: Samuel Bouche, NP  Brief History/Interval Summary: 26 year old Caucasian male with history of type 1 diabetes since age 14, history of hypertension, reflux presented with 1 day history of nausea and vomiting and abdominal discomfort.  Had a recent switch from his Humalog 75/25 insulin to 70/30 insulin due to insurance reasons.  Was noted to have diabetic ketoacidosis.  Hospitalized for further management.  Denies noncompliance with his insulin regimen.  No infectious etiology has been found.  Consultants: None  Procedures: None    Subjective/Interval History: Patient denies any nausea vomiting.  He wants to go home.  He was explained the reason why he needs to stay in the hospital another day.  His mother is at the bedside.    Assessment/Plan:  Diabetic ketoacidosis in the setting of diabetes mellitus type 1, uncontrolled with hyperglycemia Patient presented with elevated anion gap and high beta hydroxy butyrate acid levels.  Noted to be in DKA.  Started on IV insulin. Glucose levels have improved.  Anion gap is finally closed.  BHB levels have improved. Patient will be transition to glargine insulin and SSI today.  Diet will be initiated. Patient wants to transition from the 70/30 insulin to Lantus and NovoLog.  He says that his insurance will cover these medications. HbA1c is 11.4.   No precipitating factor identified for his DKA.  Elevated WBC likely reactive versus secondary to hemoconcentration.  Noted to be normal this morning.  Drop in hemoglobin is likely because he was hemoconcentrated on admission and has experienced a dilutional drop.  No overt bleeding. UA was negative for infection.  Chest x-ray was unremarkable as well.  Change in insulin could be responsible.  Patient denies noncompliance.  Acute kidney injury Likely due to dehydration from nausea and vomiting.  Improved  with IV hydration.  ACE inhibitor on hold.  Hypokalemia We will aggressively replete.  Check magnesium level.  Essential hypertension ACE inhibitor on hold.  Blood pressure is reasonably well controlled.  Generalized anxiety disorder Continue Wellbutrin.  DVT Prophylaxis: Initiate Lovenox Code Status: Full code Family Communication: Discussed with the patient Disposition Plan: Hopefully return home when improved.  Start mobilizing  Status is: Inpatient Remains inpatient appropriate because: DKA      Medications: Scheduled:  buPROPion  300 mg Oral Daily   Chlorhexidine Gluconate Cloth  6 each Topical Daily   enoxaparin (LOVENOX) injection  40 mg Subcutaneous Daily   insulin aspart  0-15 Units Subcutaneous TID WC   insulin aspart  0-5 Units Subcutaneous QHS   insulin glargine-yfgn  20 Units Subcutaneous Daily   nicotine  14 mg Transdermal Daily   pantoprazole  20 mg Oral Daily   potassium chloride  40 mEq Oral Q4H   potassium chloride  40 mEq Oral Once   Continuous:  dextrose 5% lactated ringers with KCl 20 mEq/L 125 mL/hr at 03/19/22 0900   insulin 0.9 Units/hr (03/19/22 0900)   YBO:FBPZWCHENIDPO, ALPRAZolam, dextrose, melatonin, ondansetron (ZOFRAN) IV, mouth rinse  Antibiotics: Anti-infectives (From admission, onward)    None       Objective:  Vital Signs  Vitals:   03/19/22 0600 03/19/22 0700 03/19/22 0820 03/19/22 0841  BP:   (!) 142/102   Pulse: 86 96 98   Resp: 19 12 19    Temp:    98.1 F (36.7 C)  TempSrc:    Oral  SpO2: 97% 99% 98%   Weight:  Height:        Intake/Output Summary (Last 24 hours) at 03/19/2022 0945 Last data filed at 03/19/2022 0900 Gross per 24 hour  Intake 4057.63 ml  Output --  Net 4057.63 ml    Filed Weights   03/17/22 1940 03/18/22 0225  Weight: 54.4 kg 54.4 kg    General appearance: Awake alert.  In no distress Resp: Clear to auscultation bilaterally.  Normal effort Cardio: S1-S2 is normal regular.  No  S3-S4.  No rubs murmurs or bruit GI: Abdomen is soft.  Nontender nondistended.  Bowel sounds are present normal.  No masses organomegaly Extremities: No edema.  Full range of motion of lower extremities. Neurologic: Alert and oriented x3.  No focal neurological deficits.     Lab Results:  Data Reviewed: I have personally reviewed following labs and reports of the imaging studies  CBC: Recent Labs  Lab 03/17/22 1947 03/17/22 1955 03/19/22 0348  WBC 17.0*  --  8.6  HGB 18.1* 18.0* 14.5  HCT 51.3 53.0* 41.4  MCV 86.9  --  86.8  PLT 478*  --  273     Basic Metabolic Panel: Recent Labs  Lab 03/18/22 0246 03/18/22 0822 03/18/22 1344 03/18/22 1831 03/19/22 0348  NA 138 134* 135 137 136  K 4.2 3.6 3.7 3.4* 2.8*  CL 108 104 105 107 105  CO2 14* 16* 15* 20* 24  GLUCOSE 169* 115* 172* 160* 155*  BUN 16 12 10 8  <5*  CREATININE 0.96 0.71 0.72 0.65 0.52*  CALCIUM 9.1 8.5* 8.4* 8.4* 8.2*     GFR: Estimated Creatinine Clearance: 105.8 mL/min (A) (by C-G formula based on SCr of 0.52 mg/dL (L)).  Liver Function Tests: Recent Labs  Lab 03/17/22 1947  AST 14*  ALT 16  ALKPHOS 101  BILITOT 0.5  PROT 8.4*  ALBUMIN 5.2*      CBG: Recent Labs  Lab 03/19/22 0152 03/19/22 0250 03/19/22 0503 03/19/22 0649 03/19/22 0906  GLUCAP 165* 143* 180* 181* 254*      Recent Results (from the past 240 hour(s))  MRSA Next Gen by PCR, Nasal     Status: None   Collection Time: 03/18/22  2:49 AM   Specimen: Nasal Mucosa; Nasal Swab  Result Value Ref Range Status   MRSA by PCR Next Gen NOT DETECTED NOT DETECTED Final    Comment: (NOTE) The GeneXpert MRSA Assay (FDA approved for NASAL specimens only), is one component of a comprehensive MRSA colonization surveillance program. It is not intended to diagnose MRSA infection nor to guide or monitor treatment for MRSA infections. Test performance is not FDA approved in patients less than 20 years old. Performed at [facility], [address] [place], [place]       Radiology Studies: DG Chest Port 1 View  Result Date: 03/17/2022 CLINICAL DATA:  High blood sugar and tachycardia EXAM: PORTABLE CHEST 1 VIEW COMPARISON:  Radiographs 07/02/2016 FINDINGS: No focal consolidation, pleural effusion, or pneumothorax. Normal cardiomediastinal silhouette. No acute osseous abnormality. IMPRESSION: No active disease. Electronically Signed   By: 07/04/2016 M.D.   On: 03/17/2022 21:39       LOS: 1 day   03/19/2022  [facility] Pager on [email or web address]  03/19/2022, 9:45 AM

## 2022-03-20 ENCOUNTER — Other Ambulatory Visit (HOSPITAL_COMMUNITY): Payer: Self-pay

## 2022-03-20 ENCOUNTER — Ambulatory Visit
Admission: EM | Admit: 2022-03-20 | Discharge: 2022-03-20 | Disposition: A | Discharge status: Home / Self Care | Payer: Commercial Managed Care - HMO | Attending: Physician Assistant | Admitting: Physician Assistant

## 2022-03-20 ENCOUNTER — Ambulatory Visit (INDEPENDENT_AMBULATORY_CARE_PROVIDER_SITE_OTHER): Payer: Commercial Managed Care - HMO

## 2022-03-20 ENCOUNTER — Other Ambulatory Visit: Payer: Self-pay

## 2022-03-20 DIAGNOSIS — R102 Pelvic and perineal pain: Secondary | ICD-10-CM | POA: Insufficient documentation

## 2022-03-20 DIAGNOSIS — F17210 Nicotine dependence, cigarettes, uncomplicated: Secondary | ICD-10-CM | POA: Insufficient documentation

## 2022-03-20 DIAGNOSIS — R1030 Lower abdominal pain, unspecified: Secondary | ICD-10-CM

## 2022-03-20 DIAGNOSIS — R350 Frequency of micturition: Secondary | ICD-10-CM | POA: Diagnosis not present

## 2022-03-20 DIAGNOSIS — R000 Tachycardia, unspecified: Secondary | ICD-10-CM | POA: Insufficient documentation

## 2022-03-20 LAB — BASIC METABOLIC PANEL
Anion gap: 9 (ref 5–15)
BUN: <5 mg/dL — ABNORMAL LOW (ref 6–20)
CO2: 24 mmol/L (ref 22–32)
Calcium: 8.6 mg/dL — ABNORMAL LOW (ref 8.9–10.3)
Chloride: 104 mmol/L (ref 98–111)
Creatinine, Ser: 0.52 mg/dL — ABNORMAL LOW (ref 0.61–1.24)
GFR, Estimated: >60 mL/min (ref >60)
Glucose, Bld: 142 mg/dL — ABNORMAL HIGH (ref 70–99)
Potassium: 3.4 mmol/L — ABNORMAL LOW (ref 3.5–5.1)
Sodium: 137 mmol/L (ref 135–145)

## 2022-03-20 LAB — POCT URINALYSIS DIP (MANUAL ENTRY)
Bilirubin, UA: NEGATIVE
Glucose, UA: 100 mg/dL — AB
Leukocytes, UA: NEGATIVE
Nitrite, UA: NEGATIVE
Protein Ur, POC: NEGATIVE mg/dL
Spec Grav, UA: 1.01 (ref 1.010–1.025)
Urobilinogen, UA: 0.2 E.U./dL
pH, UA: 7 (ref 5.0–8.0)

## 2022-03-20 LAB — MAGNESIUM: Magnesium: 2.2 mg/dL (ref 1.7–2.4)

## 2022-03-20 LAB — GLUCOSE, CAPILLARY
Glucose-Capillary: 164 mg/dL — ABNORMAL HIGH (ref 70–99)
Glucose-Capillary: 173 mg/dL — ABNORMAL HIGH (ref 70–99)

## 2022-03-20 MED ORDER — INSULIN ASPART 100 UNIT/ML IJ SOLN: INTRAMUSCULAR | 11 refills | Status: DC

## 2022-03-20 MED ORDER — INSULIN DETEMIR 100 UNIT/ML ~~LOC~~ SOLN
20.0000 [IU] | Freq: Every day | SUBCUTANEOUS | 2 refills | Status: DC
  Filled 2022-03-20: qty 10, 28d supply, fill #0

## 2022-03-20 MED ORDER — INSULIN GLARGINE 100 UNIT/ML ~~LOC~~ SOLN: 20.0000 [IU] | Freq: Every day | SUBCUTANEOUS | 1 refills | Status: DC

## 2022-03-20 MED ORDER — INSULIN LISPRO 100 UNIT/ML IJ SOLN
INTRAMUSCULAR | 11 refills | Status: DC
  Filled 2022-03-20: qty 10, 28d supply, fill #0

## 2022-03-20 MED ORDER — POTASSIUM CHLORIDE CRYS ER 20 MEQ PO TBCR
40.0000 meq | EXTENDED_RELEASE_TABLET | Freq: Once | ORAL | Status: AC
  Administered 2022-03-20: 40 meq via ORAL
  Filled 2022-03-20: qty 2

## 2022-03-20 NOTE — Discharge Instructions (Addendum)
Your x-ray was normal.  Given how tender you are I do think you need more advanced imaging.  Please go to the emergency room for further evaluation and management.

## 2022-03-20 NOTE — Discharge Summary (Signed)
[facility]  Physician Discharge Summary   Patient ID: Robert Villa MRN: [id number] DOB/AGE: Feb 18, 1996 26 y.o.  Admit date: 03/17/2022 Discharge date: 03/20/2022    PCP: Christen Butter, NP  DISCHARGE DIAGNOSES:    DKA, type 1 (HCC)   AKI (acute kidney injury) (HCC)   Essential hypertension   Generalized anxiety disorder   DKA (diabetic ketoacidosis) (HCC)   RECOMMENDATIONS FOR OUTPATIENT FOLLOW UP: Follow-up with PCP within 1 week   Home Health: None Equipment/Devices: None  CODE STATUS: Full code  DISCHARGE CONDITION: fair  Diet recommendation: Modified carbohydrate  INITIAL HISTORY: 26 year old Caucasian male with history of type 1 diabetes since age 23, history of hypertension, reflux presented with 1 day history of nausea and vomiting and abdominal discomfort.  Had a recent switch from his Humalog 75/25 insulin to 70/30 insulin due to insurance reasons.  Was noted to have diabetic ketoacidosis.  Hospitalized for further management.  Denies noncompliance with his insulin regimen.  No infectious etiology has been found.    HOSPITAL COURSE:   Diabetic ketoacidosis in the setting of diabetes mellitus type 1, uncontrolled with hyperglycemia Patient presented with elevated anion gap and high beta hydroxy butyrate acid levels.  Noted to be in DKA.  Started on IV insulin. Glucose levels have improved.  Anion gap is finally closed.  BHB levels have improved.  Patient was transitioned to glargine insulin and SSI.  Initially wanted to be discharged on Lantus and NovoLog vials.  After further discussions and due to problems with his insurance he will be discharged on Levemir and Humalog vials. HbA1c is 11.4.  He will discuss further management of his diabetes with his primary care provider at follow-up. No precipitating factor identified for his DKA.  Elevated WBC likely reactive versus secondary to hemoconcentration.   UA was negative for infection.  Chest x-ray was  unremarkable as well.  Change in insulin could be responsible.  Patient denies noncompliance.   Acute kidney injury Resolved.  Okay to resume ACE inhibitor.   Hypokalemia Repleted   Essential hypertension Okay to resume ACE inhibitor   Generalized anxiety disorder Continue Wellbutrin.    Patient is stable.  Tolerating his diet.  Denies any abdominal pain nausea or vomiting.  Okay for discharge home today.  Discussed with patient and his wife.   PERTINENT LABS:  The results of significant diagnostics from this hospitalization (including imaging, microbiology, ancillary and laboratory) are listed below for reference.    Microbiology: Recent Results (from the past 240 hour(s))  Urine Culture     Status: None   Collection Time: 03/17/22  7:47 PM   Specimen: Urine, Clean Catch  Result Value Ref Range Status   Specimen Description   Final    URINE, CLEAN CATCH Performed at [facility], [address], [place], [place] [address]    Special Requests   Final    NONE Performed at [facility], [address] [place], [place] [address]    Culture   Final    NO GROWTH Performed at [facility], [address], [place], [place] [address]    Report Status 03/19/2022 FINAL  Final  MRSA Next Gen by PCR, Nasal     Status: None   Collection Time: 03/18/22  2:49 AM   Specimen: Nasal Mucosa; Nasal Swab  Result Value Ref Range Status   MRSA by PCR Next Gen NOT DETECTED NOT DETECTED Final    Comment: (NOTE) The GeneXpert MRSA Assay (FDA approved  for NASAL specimens only), is one component of a comprehensive MRSA colonization surveillance program. It is not intended to diagnose MRSA infection nor to guide or monitor treatment for MRSA infections. Test performance is not FDA approved in patients less than 17 years old. Performed at [facility], [address], [place], [place] [address]      Labs:   Basic  Metabolic Panel: Recent Labs  Lab 03/18/22 0822 03/18/22 1344 03/18/22 1831 03/19/22 0348 03/20/22 0334  NA 134* 135 137 136 137  K 3.6 3.7 3.4* 2.8* 3.4*  CL 104 105 107 105 104  CO2 16* 15* 20* 24 24  GLUCOSE 115* 172* 160* 155* 142*  BUN 12 10 8  <5* <5*  CREATININE 0.71 0.72 0.65 0.52* 0.52*  CALCIUM 8.5* 8.4* 8.4* 8.2* 8.6*  MG  --   --   --  1.5* 2.2   Liver Function Tests: Recent Labs  Lab 03/17/22 1947  AST 14*  ALT 16  ALKPHOS 101  BILITOT 0.5  PROT 8.4*  ALBUMIN 5.2*    CBC: Recent Labs  Lab 03/17/22 1947 03/17/22 1955 03/19/22 0348  WBC 17.0*  --  8.6  HGB 18.1* 18.0* 14.5  HCT 51.3 53.0* 41.4  MCV 86.9  --  86.8  PLT 478*  --  273     CBG: Recent Labs  Lab 03/19/22 1107 03/19/22 1639 03/19/22 2058 03/20/22 0728 03/20/22 1139  GLUCAP 216* 122* 199* 164* 173*     IMAGING STUDIES DG Abdomen 1 View  Result Date: 03/20/2022 CLINICAL DATA:  Lower abdominal pain EXAM: ABDOMEN - 1 VIEW COMPARISON:  06/08/2016 FINDINGS: The bowel gas pattern is normal. No radio-opaque calculi or other significant radiographic abnormality are seen. No acute bony findings. IMPRESSION: Negative. Electronically Signed   By: 06/10/2016 D.O.   On: 03/20/2022 17:47   DG Chest Port 1 View  Result Date: 03/17/2022 CLINICAL DATA:  High blood sugar and tachycardia EXAM: PORTABLE CHEST 1 VIEW COMPARISON:  Radiographs 07/02/2016 FINDINGS: No focal consolidation, pleural effusion, or pneumothorax. Normal cardiomediastinal silhouette. No acute osseous abnormality. IMPRESSION: No active disease. Electronically Signed   By: 07/04/2016 M.D.   On: 03/17/2022 21:39    DISCHARGE EXAMINATION: Vitals:   03/19/22 1829 03/19/22 2122 03/20/22 0131 03/20/22 0628  BP: (!) 146/94 (!) 142/104 126/88 (!) 124/94  Pulse: (!) 121 (!) 106 98 100  Resp: 18 18 18 18   Temp: 98.6 F (37 C) 98.6 F (37 C) 97.9 F (36.6 C) 97.9 F (36.6 C)  TempSrc: Oral Oral Oral Oral  SpO2: 100% 99%  100% 100%  Weight:      Height:       General appearance: Awake alert.  In no distress Resp: Clear to auscultation bilaterally.  Normal effort Cardio: S1-S2 is normal regular.  No S3-S4.  No rubs murmurs or bruit    DISPOSITION: Home  Discharge Instructions     Call MD for:  difficulty breathing, headache or visual disturbances   Complete by: As directed    Call MD for:  extreme fatigue   Complete by: As directed    Call MD for:  persistant dizziness or light-headedness   Complete by: As directed    Call MD for:  persistant nausea and vomiting   Complete by: As directed    Call MD for:  severe uncontrolled pain   Complete by: As directed    Call MD for:  temperature >100.4   Complete by: As directed  Diet Carb Modified   Complete by: As directed    Discharge instructions   Complete by: As directed    Please take your medications as prescribed.  Reach out to your primary care provider whenever you think you are ready to switch over to the pen form of insulin.  You were cared for by a hospitalist during your hospital stay. If you have any questions about your discharge medications or the care you received while you were in the hospital after you are discharged, you can call the unit and asked to speak with the hospitalist on call if the hospitalist that took care of you is not available. Once you are discharged, your primary care physician will handle any further medical issues. Please note that NO REFILLS for any discharge medications will be authorized once you are discharged, as it is imperative that you return to your primary care physician (or establish a relationship with a primary care physician if you do not have one) for your aftercare needs so that they can reassess your need for medications and monitor your lab values. If you do not have a primary care physician, you can call [phone] for a physician referral.   Increase activity slowly   Complete by: As directed            Allergies as of 03/20/2022       Reactions   Bee Venom Anaphylaxis   Claritin [loratadine]    Nose bleeds   Zoloft [sertraline Hcl]    Heavy Sweats & Jittery        Medication List     STOP taking these medications    insulin NPH-regular Human (70-30) 100 UNIT/ML injection   pantoprazole 20 MG tablet Commonly known as: PROTONIX       TAKE these medications    acetaminophen 500 MG tablet Commonly known as: TYLENOL Take 1,000 mg by mouth every 8 (eight) hours as needed for mild pain.   ALPRAZolam 0.5 MG tablet Commonly known as: XANAX Take 1 tablet (0.5 mg total) by mouth 2 (two) times daily as needed for anxiety.   BD Insulin Syringe U/F 31G X 5/16" 0.3 ML Misc Generic drug: Insulin Syringe-Needle U-100 AS DIRECTED FOUR TIMES A DAY WITH INSULIN DX E10.9 90 DAYS   buPROPion 300 MG 24 hr tablet Commonly known as: WELLBUTRIN XL Take 1 tablet (300 mg total) by mouth daily.   diphenhydrAMINE 25 MG tablet Commonly known as: BENADRYL Take 25 mg by mouth daily as needed for allergies.   famotidine 20 MG tablet Commonly known as: PEPCID Take 20 mg by mouth daily as needed for heartburn or indigestion.   HumaLOG 100 UNIT/ML injection Generic drug: insulin lispro Take 3 times daily before your meals as per sliding scale. CBG 70 - 120: 0 units. CBG 121 - 150: 2 units. CBG 151 - 200: 3 units. CBG 201 - 250: 5 units. CBG 251 - 300: 8 units and call your doctor for further instructions.   insulin detemir 100 UNIT/ML injection Commonly known as: LEVEMIR Inject 0.2 mLs (20 Units total) into the skin daily.   lisinopril 10 MG tablet Commonly known as: ZESTRIL Take 1 tablet (10 mg total) by mouth at bedtime.   sodium chloride 0.65 % Soln nasal spray Commonly known as: OCEAN Place 1 spray into both nostrils daily as needed for congestion.          Follow-up Information     Samuel Bouche, NP. Schedule an appointment as soon  as possible for a visit in 1  week(s).   Specialty: Nurse Practitioner Why: post hospitalization follow up Contact information: [address] [place] [address] [phone]                 TOTAL DISCHARGE TIME: 35 minutes  Marlan Steward  [facility] Pager on [email or web address]  03/21/2022, 12:14 PM

## 2022-03-20 NOTE — ED Provider Notes (Signed)
[name]    CSN: [id number] Arrival date & time: 03/20/22  1701      History   Chief Complaint Chief Complaint  Patient presents with   Abdominal Pain    HPI Robert Villa is a 26 y.o. male.   Patient presents today with a several hour history of lower abdominal pain.  Reports associated urinary frequency but denies any dysuria, hematuria, flank pain.  Denies any fever, nausea, vomiting, diarrhea.  Does report that he has had constipation over the past several days with last bowel movement yesterday morning which he describes as hard and small.  Pain is rated 7 on a 0-10 pain scale, described as intense pressure/discomfort, no alleviating factors identified.  He was recently hospitalized from 03/17/2022 to today (03/20/2022) for DKA.  Reports that DKA likely resulted from his endocrinologist changing his insulins and the symptoms have since resolved.  He denies any shortness of breath, nausea, vomiting.  He denies any catheter use while he was in the hospital.  Denies history of nephrolithiasis or BPH.  Reports that he feels he can empty his bladder but is continues to have discomfort in this region.    Past Medical History:  Diagnosis Date   Anxiety    Diabetes mellitus    Essential hypertension    GERD (gastroesophageal reflux disease) 3 years   Mental disorder     Patient Active Problem List   Diagnosis Date Noted   DKA (diabetic ketoacidosis) ([place]) 03/18/2022   Generalized anxiety disorder 08/05/2019   Essential hypertension 07/02/2016   AKI (acute kidney injury) ([place]) 07/02/2016   History of marijuana use 07/02/2016   Tobacco abuse 07/02/2016   Type 1 diabetes mellitus without complications ([place]) 73/71/0626   Tachycardia 11/14/2011   DKA, type 1 ([place]) 11/13/2011   Leucocytosis 11/13/2011    Past Surgical History:  Procedure Laterality Date   ANKLE SURGERY     FRACTURE SURGERY     NOSE SURGERY         Home Medications    Prior to Admission  medications   Medication Sig Start Date End Date Taking? Authorizing Provider  acetaminophen (TYLENOL) 500 MG tablet Take 1,000 mg by mouth every 8 (eight) hours as needed for mild pain.    [provider]  ALPRAZolam Duanne Moron) 0.5 MG tablet Take 1 tablet (0.5 mg total) by mouth 2 (two) times daily as needed for anxiety. 11/07/21   Samuel Bouche, NP  BD INSULIN SYRINGE U/F 31G X 5/16" 0.3 ML MISC AS DIRECTED FOUR TIMES A DAY WITH INSULIN DX E10.9 90 DAYS 08/05/19   Brunetta Jeans, PA-C  buPROPion (WELLBUTRIN XL) 300 MG 24 hr tablet Take 1 tablet (300 mg total) by mouth daily. 11/07/21   Samuel Bouche, NP  diphenhydrAMINE (BENADRYL) 25 MG tablet Take 25 mg by mouth daily as needed for allergies.    [provider]  famotidine (PEPCID) 20 MG tablet Take 20 mg by mouth daily as needed for heartburn or indigestion.    [provider]  insulin detemir (LEVEMIR) 100 UNIT/ML injection Inject 0.2 mLs (20 Units total) into the skin daily. 03/20/22   Bonnielee Haff, MD  insulin lispro (HUMALOG) 100 UNIT/ML injection Take 3 times daily before your meals as per sliding scale. CBG 70 - 120: 0 units. CBG 121 - 150: 2 units. CBG 151 - 200: 3 units. CBG 201 - 250: 5 units. CBG 251 - 300: 8 units and call your doctor for further instructions. 03/20/22  Osvaldo Shipper, MD  lisinopril (ZESTRIL) 10 MG tablet Take 1 tablet (10 mg total) by mouth at bedtime. 11/07/21   Christen Butter, NP  sodium chloride (OCEAN) 0.65 % SOLN nasal spray Place 1 spray into both nostrils daily as needed for congestion.    [provider]    Family History Family History  Problem Relation Age of Onset   Healthy Mother    Cancer Father        Prostate   Healthy Sister    Cancer Maternal Grandmother     Social History Social History   Tobacco Use   Smoking status: Every Day    Packs/day: 0.01    Years: 11.00    Total pack years: 0.11    Types: Cigarettes   Smokeless tobacco: Never  Vaping Use    Vaping Use: Never used  Substance Use Topics   Alcohol use: Not Currently   Drug use: Not Currently    Types: Marijuana    Comment: Quit the past year     Allergies   Bee venom, Claritin [loratadine], and Zoloft [sertraline hcl]   Review of Systems Review of Systems  Constitutional:  Positive for activity change. Negative for appetite change, fatigue and fever.  Respiratory:  Negative for cough and shortness of breath.   Cardiovascular:  Negative for chest pain.  Gastrointestinal:  Positive for abdominal pain and constipation. Negative for blood in stool, diarrhea, nausea and vomiting.  Genitourinary:  Positive for frequency. Negative for difficulty urinating, dysuria, flank pain, hematuria and urgency.     Physical Exam Triage Vital Signs ED Triage Vitals [03/20/22 1710]  Enc Vitals Group     BP (!) 137/100     Pulse Rate (!) 119     Resp 14     Temp 98.8 F (37.1 C)     Temp Source Oral     SpO2 98 %     Weight      Height      Head Circumference      Peak Flow      Pain Score 8     Pain Loc      Pain Edu?      Excl. in GC?    No data found.  Updated Vital Signs BP (!) 137/100 (BP Location: Right Arm)   Pulse (!) 119   Temp 98.8 F (37.1 C) (Oral)   Resp 14   SpO2 98%   Visual Acuity Right Eye Distance:   Left Eye Distance:   Bilateral Distance:    Right Eye Near:   Left Eye Near:    Bilateral Near:     Physical Exam Vitals reviewed.  Constitutional:      General: He is awake.     Appearance: Normal appearance. He is well-developed. He is not ill-appearing.     Comments: Very pleasant male appears stated age in no acute distress sitting comfortably in exam room  HENT:     Head: Normocephalic and atraumatic.     Mouth/Throat:     Pharynx: No oropharyngeal exudate, posterior oropharyngeal erythema or uvula swelling.  Cardiovascular:     Rate and Rhythm: Normal rate and regular rhythm.     Heart sounds: Normal heart sounds, S1 normal and S2  normal. No murmur heard. Pulmonary:     Effort: Pulmonary effort is normal.     Breath sounds: Normal breath sounds. No stridor. No wheezing, rhonchi or rales.     Comments: Clear to auscultation bilaterally Abdominal:  General: Bowel sounds are normal.     Palpations: Abdomen is soft.     Tenderness: There is abdominal tenderness in the suprapubic area. There is no right CVA tenderness, left CVA tenderness, guarding or rebound. Negative signs include Rovsing's sign and McBurney's sign.     Comments: Tenderness palpation in suprapubic region.  Neurological:     Mental Status: He is alert.  Psychiatric:        Behavior: Behavior is cooperative.      UC Treatments / Results  Labs (all labs ordered are listed, but only abnormal results are displayed) Labs Reviewed  POCT URINALYSIS DIP (MANUAL ENTRY) - Abnormal; Notable for the following components:      Result Value   Glucose, UA =100 (*)    Ketones, POC UA trace (5) (*)    Blood, UA trace-intact (*)    All other components within normal limits  URINE CULTURE    EKG   Radiology DG Abdomen 1 View  Result Date: 03/20/2022 CLINICAL DATA:  Lower abdominal pain EXAM: ABDOMEN - 1 VIEW COMPARISON:  06/08/2016 FINDINGS: The bowel gas pattern is normal. No radio-opaque calculi or other significant radiographic abnormality are seen. No acute bony findings. IMPRESSION: Negative. Electronically Signed   By: Duanne Guess D.O.   On: 03/20/2022 17:47    Procedures Procedures (including critical care time)  Medications Ordered in UC Medications - No data to display  Initial Impression / Assessment and Plan / UC Course  I have reviewed the triage vital signs and the nursing notes.  Pertinent labs & imaging results that were available during my care of the patient were reviewed by me and considered in my medical decision making (see chart for details).     Patient is tachycardic, however, this appears to be at baseline that have  resolved at the time of discharge from hospital earlier today.  He does have significant tenderness on palpation in suprapubic region.  UA showed no evidence of infection.  Urine culture was obtained but will defer antibiotics until culture results are available.  KUB was obtained that showed no radiopaque calculi or obvious obstruction.  Discussed that given his significant tenderness on exam I do recommend he go to the emergency room for further evaluation and management.  Called and discussed case with Dr. Marlinda Mike who agreed with treatment plan.  Patient is hesitant but did agree that if his symptoms persist/do not improve he will go to the ER for further evaluation and management.  He was stable at the time of discharge.  Final Clinical Impressions(s) / UC Diagnoses   Final diagnoses:  Suprapubic pain     Discharge Instructions      Your x-ray was normal.  Given how tender you are I do think you need more advanced imaging.  Please go to the emergency room for further evaluation and management.     ED Prescriptions   None    PDMP not reviewed this encounter.   Jeani Hawking, PA-C 03/20/22 1832

## 2022-03-20 NOTE — Progress Notes (Signed)
Patient refused Lovenox injection, MD made aware.  

## 2022-03-20 NOTE — Progress Notes (Signed)
  Transition of Care [facility]) Screening Note   Patient Details  Name: Robert Villa Date of Birth: [birth year]   Transition of Care [facility]) CM/SW Contact:    Lennart Pall, LCSW Phone Number: 03/20/2022, 10:05 AM    Transition of Care Department [facility]) has reviewed patient and no TOC needs have been identified at this time. We will continue to monitor patient advancement through interdisciplinary progression rounds. If new patient transition needs arise, please place a TOC consult.

## 2022-03-20 NOTE — Progress Notes (Signed)
Discharge instructions given to patient and all questions were answered.  

## 2022-03-20 NOTE — Inpatient Diabetes Management (Signed)
Inpatient Diabetes Program Recommendations  AACE/ADA: New Consensus Statement on Inpatient Glycemic Control (2015)  Target Ranges:  Prepandial:   less than 140 mg/dL      Peak postprandial:   less than 180 mg/dL (1-2 hours)      Critically ill patients:  140 - 180 mg/dL   Lab Results  Component Value Date   GLUCAP 164 (H) 03/20/2022   HGBA1C 11.4 (H) 03/18/2022    Latest Reference Range & Units 03/19/22 16:39 03/19/22 20:58 03/20/22 07:28  Glucose-Capillary 70 - 99 mg/dL 122 (H) 199 (H) 164 (H)  (H): Data is abnormally high  Diabetes history: Type 1 Dm Outpatient Diabetes medications: Novolog 70/30 20 units BID Current orders for Inpatient glycemic control: Semglee 20 units QD, Novolog 0-15 units TID & HS   Inpatient Diabetes Program Recommendations:    Spoke with patient and patient's mother regarding outpatient diabetes management. Has been struggling with Novolog 70/30 due to "peaks and timing". Reviewed patient's current A1c of 11.4%. Explained what a A1c is and what it measures. Also reviewed goal A1c with patient, importance of good glucose control @ home, and blood sugar goals. Reviewed patho of DM, role of pancreas, survival skills, interventions, DKA, differences between 70/30, nph and basal insulin, vascular changes and commorbidities. Has a meter at bedside and is interested in Dexcom. Has used Freestyle libre in the past, but would prefer Dexcom. Reports checking CBGs multiple times per day (200-400+ mg/dL) Understands carbohydrate counting. States he will usually do 1:10 carb ratio. Has a follow up appointment with PCP who manages diabetes.  Away from bedside, patient's mother states that he recently lost grandmother and he has struggled with diabetes since her death. Hopes this will be a "wake up call".    At discharge patient requesting vial/syringe: -Levemir [phone] - Humalog #[id number] Attempting price check with pharmacy; struggling to find insurance.  Awaiting  plan.  Thanks, Robert Curb, MSN, RNC-OB Diabetes Coordinator [phone] (8a-5p)

## 2022-03-20 NOTE — ED Triage Notes (Signed)
Pt presents with c/o abdominal pain and polyuria since being d/c'd from the hospital today.

## 2022-03-21 ENCOUNTER — Other Ambulatory Visit: Payer: Self-pay

## 2022-03-21 ENCOUNTER — Ambulatory Visit: Payer: Commercial Managed Care - HMO | Admitting: Medical-Surgical

## 2022-03-21 ENCOUNTER — Telehealth: Payer: Self-pay | Admitting: Emergency Medicine

## 2022-03-21 NOTE — Telephone Encounter (Signed)
Spoke with patient and states that hes doing a little better from his visit yesterday.  Will follow up as needed.

## 2022-03-22 ENCOUNTER — Telehealth: Payer: Self-pay | Admitting: General Practice

## 2022-03-22 LAB — URINE CULTURE: Culture: NO GROWTH

## 2022-03-22 NOTE — Telephone Encounter (Signed)
Transition Care Management Follow-up Telephone Call Date of discharge and from where: 03/20/22 from [facility] How have you been since you were released from the hospital? Patient is doing much better with readings in the 100s.  Any questions or concerns? No  Items Reviewed: Did the pt receive and understand the discharge instructions provided? Yes  Medications obtained and verified? Yes  Other? No  Any new allergies since your discharge? No  Dietary orders reviewed? Yes Do you have support at home? Yes   Home Care and Equipment/Supplies: Were home health services ordered? no  Functional Questionnaire: (I = Independent and D = Dependent) ADLs: I  Bathing/Dressing- I  Meal Prep- I  Eating- I  Maintaining continence- I  Transferring/Ambulation- I  Managing Meds- I  Follow up appointments reviewed:  PCP Hospital f/u appt confirmed? Yes  Scheduled to see Samuel Bouche, NP on 03/29/22 @ 1030. [facility] f/u appt confirmed? No   Are transportation arrangements needed? No  If their condition worsens, is the pt aware to call PCP or go to the Emergency Dept.? Yes Was the patient provided with contact information for the PCP's office or ED? Yes Was to pt encouraged to call back with questions or concerns? Yes

## 2022-03-29 ENCOUNTER — Encounter: Payer: Self-pay | Admitting: Medical-Surgical

## 2022-03-29 ENCOUNTER — Ambulatory Visit (INDEPENDENT_AMBULATORY_CARE_PROVIDER_SITE_OTHER): Payer: Commercial Managed Care - HMO | Admitting: Medical-Surgical

## 2022-03-29 VITALS — BP 132/85 | HR 109 | Resp 20 | Ht 68.0 in | Wt 127.6 lb

## 2022-03-29 DIAGNOSIS — F411 Generalized anxiety disorder: Secondary | ICD-10-CM

## 2022-03-29 DIAGNOSIS — E109 Type 1 diabetes mellitus without complications: Secondary | ICD-10-CM | POA: Diagnosis not present

## 2022-03-29 DIAGNOSIS — Z09 Encounter for follow-up examination after completed treatment for conditions other than malignant neoplasm: Secondary | ICD-10-CM | POA: Diagnosis not present

## 2022-03-29 MED ORDER — ALPRAZOLAM 0.5 MG PO TABS: 0.5000 mg | ORAL_TABLET | Freq: Two times a day (BID) | ORAL | 1 refills | Status: DC | PRN

## 2022-03-29 MED ORDER — BUPROPION HCL ER (XL) 300 MG PO TB24: 300.0000 mg | ORAL_TABLET | Freq: Every day | ORAL | 1 refills | Status: DC

## 2022-03-29 MED ORDER — INSULIN GLARGINE 100 UNIT/ML ~~LOC~~ SOLN: 20.0000 [IU] | Freq: Every day | SUBCUTANEOUS | 5 refills | Status: DC

## 2022-03-29 MED ORDER — INSULIN LISPRO 100 UNIT/ML IJ SOLN: INTRAMUSCULAR | 11 refills | Status: DC

## 2022-03-29 MED ORDER — DEXCOM G7 SENSOR MISC: 3 refills | Status: DC

## 2022-03-29 NOTE — Progress Notes (Signed)
Established Patient Office Visit  Subjective   Patient ID: Robert Villa, male   DOB: Aug 06, 1995 Age: 26 y.o. MRN: [id number]   Chief Complaint  Patient presents with   Diabetes   Hospitalization Follow-up    HPI Very pleasant 26 year old male presenting today for hospital discharge follow-up.  He was admitted to the hospital for approximately 3 days with diabetic ketoacidosis.  Had recently switched from Humalog 75/25 to 70/30 insulin because of insurance and notes that his sugars got wildly out of control.  He was hospitalized in the stepdown unit with close monitoring to get sugars under control.  During his hospitalization he was able to speak with endocrinology and his treatment has been changed to sliding scale insulin lispro with carb counting as well as Lantus insulin 20 units nightly.  The 70/30 insulin was discontinued.  Patient presents today reporting that his sugars have been extremely well controlled on the new regimen and he is very happy with this.  He is doing well with carb counting although he does have to be careful with the sliding scale insulin because there are times when his sugar starts to get a little too low.  At these times, he is not symptomatic and is able to catch it before it drops too far.  He is working to get an appointment with endocrinology but is aware that he will need to follow-up with [place] until he is able to establish with them.  He has been sticking his fingers using a traditional glucometer but is interested in getting the Dexcom 7 continuous glucose monitor since he is having to check his sugar so frequently.  Mood: Taking Wellbutrin 300 mg daily, tolerating well without side effects.  Also uses alprazolam 0.5 mg twice daily as needed but takes this very sparingly and still has some leftover from his previous prescription that was picked up in June.  Reports these medications work well to control his anxiety and depressive symptoms.  Requesting refills.    Objective:    Vitals:   03/29/22 1052  BP: 132/85  Pulse: (!) 109  Resp: 20  Height: 5\' 8"  (1.727 m)  Weight: 127 lb 9.6 oz (57.9 kg)  SpO2: 99%  BMI (Calculated): 19.41   Physical Exam Vitals reviewed.  Constitutional:      General: He is not in acute distress.    Appearance: Normal appearance. He is normal weight. He is not ill-appearing.  HENT:     Head: Normocephalic and atraumatic.  Cardiovascular:     Rate and Rhythm: Normal rate and regular rhythm.     Pulses: Normal pulses.     Heart sounds: Normal heart sounds. No murmur heard.    No friction rub. No gallop.  Pulmonary:     Effort: Pulmonary effort is normal. No respiratory distress.     Breath sounds: Normal breath sounds.  Skin:    General: Skin is warm and dry.  Neurological:     Mental Status: He is alert and oriented to person, place, and time.  Psychiatric:        Mood and Affect: Mood normal.        Behavior: Behavior normal.        Thought Content: Thought content normal.        Judgment: Judgment normal.    No results found for this or any previous visit (from the past 24 hour(s)).     The ASCVD Risk score (Arnett DK, et al., 2019) failed to calculate  for the following reasons:   The 2019 ASCVD risk score is only valid for ages 40 to 37   Assessment & Plan:   1. Hospital discharge follow-up 2. Type 1 diabetes mellitus without complications (HCC) Current glucose control doing very well on the new regimen.  Continue Lantus insulin 20 units daily and insulin lispro 3 times daily per sliding scale and carb counting.  Refills sent to his pharmacy upon his request.  Also sent Dexcom 7 CGM sensors to his pharmacy to see if we can get these covered for him.  He should have no problems qualifying for this due to the frequency of blood sugar checks and insulin injections.  Continue with plan to establish with endocrinology.  If unable to get in within the next 3 months, he should return to our office for  recheck of his A1c and medication management. - insulin lispro (HUMALOG) 100 UNIT/ML injection; Take 3 times daily before your meals as per sliding scale. CBG 70 - 120: 0 units. CBG 121 - 150: 2 units. CBG 151 - 200: 3 units. CBG 201 - 250: 5 units. CBG 251 - 300: 8 units and call your doctor for further instructions.  Dispense: 10 mL; Refill: 11 - insulin glargine (LANTUS) 100 UNIT/ML injection; Inject 0.2 mLs (20 Units total) into the skin daily.  Dispense: 20 mL; Refill: 5  3. Generalized anxiety disorder Stable on current regimen.  Continue Wellbutrin and alprazolam as prescribed.  Recommend very sparing use of alprazolam and only for severe anxiety to prevent tolerance and dependence. - buPROPion (WELLBUTRIN XL) 300 MG 24 hr tablet; Take 1 tablet (300 mg total) by mouth daily.  Dispense: 90 tablet; Refill: 1 - ALPRAZolam (XANAX) 0.5 MG tablet; Take 1 tablet (0.5 mg total) by mouth 2 (two) times daily as needed for anxiety.  Dispense: 30 tablet; Refill: 1 Return in about 3 months (around 06/29/2022) for DM follow up, chronic disease follow up.  ___________________________________________ Clearnce Sorrel, DNP, APRN, FNP-BC [facility] [place]

## 2022-04-14 ENCOUNTER — Other Ambulatory Visit: Payer: Self-pay | Admitting: Medical-Surgical

## 2022-04-14 MED ORDER — BASAGLAR KWIKPEN 100 UNIT/ML ~~LOC~~ SOPN: 20.0000 [IU] | PEN_INJECTOR | Freq: Every day | SUBCUTANEOUS | 11 refills | Status: DC

## 2022-04-29 ENCOUNTER — Encounter: Payer: Self-pay | Admitting: Medical-Surgical

## 2022-05-02 MED ORDER — INSULIN GLARGINE 100 UNIT/ML ~~LOC~~ SOLN: 20.0000 [IU] | Freq: Every day | SUBCUTANEOUS | 5 refills | Status: DC

## 2022-05-02 NOTE — Addendum Note (Signed)
Addended byChristen Butter on: 05/02/2022 01:11 PM   Modules accepted: Orders

## 2022-05-09 ENCOUNTER — Telehealth: Payer: Self-pay

## 2022-05-09 NOTE — Telephone Encounter (Signed)
Initiated Prior authorization VUY:[id number] Sensor Via: Covermymeds Case/Key:[id number]  Status: approved  as of  05/09/22 Reason:Coverage Start Date:05/04/2022;Coverage End Date:05/04/2023 Notified Pt via: Mychart

## 2022-06-30 ENCOUNTER — Ambulatory Visit: Payer: Commercial Managed Care - HMO | Admitting: Medical-Surgical

## 2022-06-30 DIAGNOSIS — E109 Type 1 diabetes mellitus without complications: Secondary | ICD-10-CM

## 2022-07-20 ENCOUNTER — Telehealth: Payer: Self-pay | Admitting: Medical-Surgical

## 2022-07-20 ENCOUNTER — Encounter: Payer: Self-pay | Admitting: Medical-Surgical

## 2022-07-20 ENCOUNTER — Ambulatory Visit (INDEPENDENT_AMBULATORY_CARE_PROVIDER_SITE_OTHER): Payer: Commercial Managed Care - HMO | Admitting: Medical-Surgical

## 2022-07-20 VITALS — BP 115/80 | HR 119 | Resp 20 | Ht 68.0 in | Wt 131.2 lb

## 2022-07-20 DIAGNOSIS — I10 Essential (primary) hypertension: Secondary | ICD-10-CM

## 2022-07-20 DIAGNOSIS — Z720 Tobacco use: Secondary | ICD-10-CM

## 2022-07-20 DIAGNOSIS — F411 Generalized anxiety disorder: Secondary | ICD-10-CM | POA: Diagnosis not present

## 2022-07-20 DIAGNOSIS — E109 Type 1 diabetes mellitus without complications: Secondary | ICD-10-CM

## 2022-07-20 LAB — POCT GLYCOSYLATED HEMOGLOBIN (HGB A1C): Hemoglobin A1C: 11.4 % — AB (ref 4.0–5.6)

## 2022-07-20 MED ORDER — ALPRAZOLAM 0.5 MG PO TABS: 0.5000 mg | ORAL_TABLET | Freq: Two times a day (BID) | ORAL | 1 refills | Status: AC | PRN

## 2022-07-20 MED ORDER — FAMOTIDINE 20 MG PO TABS: 20.0000 mg | ORAL_TABLET | Freq: Every day | ORAL | 3 refills | Status: AC | PRN

## 2022-07-20 MED ORDER — BUPROPION HCL ER (XL) 300 MG PO TB24: 300.0000 mg | ORAL_TABLET | Freq: Every day | ORAL | 3 refills | Status: AC

## 2022-07-20 MED ORDER — "INSULIN SYRINGE-NEEDLE U-100 31G X 5/16"" 0.3 ML MISC": 1.0000 | Freq: Four times a day (QID) | 11 refills | Status: AC

## 2022-07-20 MED ORDER — INSULIN GLARGINE 100 UNIT/ML ~~LOC~~ SOLN: 20.0000 [IU] | Freq: Every day | SUBCUTANEOUS | 5 refills | Status: DC

## 2022-07-20 MED ORDER — LISINOPRIL 10 MG PO TABS: 10.0000 mg | ORAL_TABLET | Freq: Every day | ORAL | 3 refills | Status: AC

## 2022-07-20 MED ORDER — INSULIN LISPRO 100 UNIT/ML IJ SOLN: INTRAMUSCULAR | 11 refills | Status: DC

## 2022-07-20 MED ORDER — DEXCOM G7 SENSOR MISC: 3 refills | Status: DC

## 2022-07-20 MED ORDER — INSULIN GLARGINE 100 UNIT/ML ~~LOC~~ SOLN: SUBCUTANEOUS | 5 refills | Status: DC

## 2022-07-20 NOTE — Progress Notes (Signed)
Established Patient Office Visit  Subjective   Patient ID: Robert Villa, male   DOB: [birth year] Age: 27 y.o. MRN: [id number]   Chief Complaint  Patient presents with   Follow-up   Diabetes    HPI Pleasant 27 year old male presenting today for the following:  Diabetes: Long history of type 1 diabetes previously managed by endocrinology.  We have completed a referral however his insurance has recently changed.  He previously saw Dr. Forde Dandy and really liked his care there.  He thought that Dr. Forde Dandy retired however he did not and he is in the works of getting reestablished there.  He is currently taking Lantus 20 units in the morning and uses insulin lispro 20 units with every meal.  Fasting blood sugars have been 200-250 and p.m. sugars have been 200-320.  During the day, his sugars are well-managed with the insulin lispro.  Wonders if he may need more Lantus insulin to help keep things regulated.  Also notes that he has been repeatedly sick with upper respiratory symptoms over the last few months and that his sugars have skyrocketed during these illnesses.  Hypertension: Taking lisinopril 10 mg daily, tolerating well without side effects.  Has been monitoring blood pressure at home and notes that his readings they are at goal.  Follows a low-sodium diet and stays active regularly.  Denies CP, SOB, palpitations, lower extremity edema, dizziness, headaches, or vision changes.  Mood: Taking Wellbutrin 300 mg daily, tolerating well without side effects.  Feels the medication works very well for him.  Also uses Xanax 0.5 mg twice daily as needed but reports sparing use of this medication and only for severe anxiety.  GERD: Taking famotidine 20 mg daily as needed.  Feels that his symptoms are well-managed with this medication and reports feeling much better since coming off of the PPI.  Objective:    Vitals:   07/20/22 1354  BP: 115/80  Pulse: (!) 119  Resp: 20  Height: 5\' 8"  (1.727 m)   Weight: 131 lb 3.2 oz (59.5 kg)  SpO2: 97%  BMI (Calculated): 19.95    Physical Exam Vitals reviewed.  Constitutional:      General: He is not in acute distress.    Appearance: Normal appearance. He is obese. He is not ill-appearing.  HENT:     Head: Normocephalic and atraumatic.  Cardiovascular:     Rate and Rhythm: Normal rate and regular rhythm.     Pulses: Normal pulses.     Heart sounds: Normal heart sounds. No murmur heard.    No friction rub. No gallop.  Pulmonary:     Effort: Pulmonary effort is normal. No respiratory distress.     Breath sounds: Normal breath sounds.  Skin:    General: Skin is warm and dry.  Neurological:     Mental Status: He is alert and oriented to person, place, and time.  Psychiatric:        Mood and Affect: Mood normal.        Behavior: Behavior normal.        Thought Content: Thought content normal.        Judgment: Judgment normal.    Results for orders placed or performed in visit on 07/20/22 (from the past 24 hour(s))  POCT HgB A1C     Status: Abnormal   Collection Time: 07/20/22  1:59 PM  Result Value Ref Range   Hemoglobin A1C 11.4 (A) 4.0 - 5.6 %   HbA1c POC (<> result, manual  entry)     HbA1c, POC (prediabetic range)     HbA1c, POC (controlled diabetic range)         The ASCVD Risk score (Arnett DK, et al., 2019) failed to calculate for the following reasons:   The 2019 ASCVD risk score is only valid for ages 93 to 26   Assessment & Plan:   1. Type 1 diabetes mellitus without complications (HCC) POCT hemoglobin A1c 11.4%.  Increasing Lantus to 20 units in the morning with 10 units at night.  Continue insulin lispro as prescribed. - POCT HgB A1C - insulin lispro (HUMALOG) 100 UNIT/ML injection; Take 3 times daily before your meals as per sliding scale. CBG 70 - 120: 0 units. CBG 121 - 150: 2 units. CBG 151 - 200: 3 units. CBG 201 - 250: 5 units. CBG 251 - 300: 8 units and call your doctor for further instructions.  Dispense:  10 mL; Refill: 11  2. Essential hypertension Checking labs as below.  Blood pressure is at goal and looks great.  Continue lisinopril 10 mg daily.  Monitor at home with goal of 130/80 or less.  Low-sodium diet and regular intentional exercise recommended. - CBC with Differential/Platelet - COMPLETE METABOLIC PANEL WITH GFR - Lipid panel - lisinopril (ZESTRIL) 10 MG tablet; Take 1 tablet (10 mg total) by mouth at bedtime.  Dispense: 90 tablet; Refill: 3  3. Tobacco abuse Smoking cessation highly recommended.  Patient is aware of quit line and resources to help with quitting.  4. Generalized anxiety disorder Stable on current medications.  Continue Wellbutrin 300 mg daily.  Recommend very sparing use of alprazolam and only for severe anxiety.  Would like to ultimately taper down or come off this medication.  May need to consider adding an SSRI in the future. - ALPRAZolam (XANAX) 0.5 MG tablet; Take 1 tablet (0.5 mg total) by mouth 2 (two) times daily as needed for anxiety.  Dispense: 30 tablet; Refill: 1 - buPROPion (WELLBUTRIN XL) 300 MG 24 hr tablet; Take 1 tablet (300 mg total) by mouth daily.  Dispense: 90 tablet; Refill: 3  Return in about 3 months (around 10/18/2022) for DM follow up.  ___________________________________________ Clearnce Sorrel, DNP, APRN, FNP-BC [facility] [place]

## 2022-07-21 LAB — CBC WITH DIFFERENTIAL/PLATELET
Absolute Monocytes: 365 cells/uL (ref 200–950)
Basophils Absolute: 99 cells/uL (ref 0–200)
Basophils Relative: 1.3 %
Eosinophils Absolute: 365 cells/uL (ref 15–500)
Eosinophils Relative: 4.8 %
HCT: 43 % (ref 38.5–50.0)
Hemoglobin: 15 g/dL (ref 13.2–17.1)
Lymphs Abs: 1664 cells/uL (ref 850–3900)
MCH: 30 pg (ref 27.0–33.0)
MCHC: 34.9 g/dL (ref 32.0–36.0)
MCV: 86 fL (ref 80.0–100.0)
MPV: 9.3 fL (ref 7.5–12.5)
Monocytes Relative: 4.8 %
Neutro Abs: 5107 cells/uL (ref 1500–7800)
Neutrophils Relative %: 67.2 %
Platelets: 286 10*3/uL (ref 140–400)
RBC: 5 10*6/uL (ref 4.20–5.80)
RDW: 10.9 % — ABNORMAL LOW (ref 11.0–15.0)
Total Lymphocyte: 21.9 %
WBC: 7.6 10*3/uL (ref 3.8–10.8)

## 2022-07-21 LAB — COMPLETE METABOLIC PANEL WITH GFR
AG Ratio: 2.1 (calc) (ref 1.0–2.5)
ALT: 14 U/L (ref 9–46)
AST: 11 U/L (ref 10–40)
Albumin: 4.5 g/dL (ref 3.6–5.1)
Alkaline phosphatase (APISO): 76 U/L (ref 36–130)
BUN: 9 mg/dL (ref 7–25)
CO2: 31 mmol/L (ref 20–32)
Calcium: 9 mg/dL (ref 8.6–10.3)
Chloride: 101 mmol/L (ref 98–110)
Creat: 0.7 mg/dL (ref 0.60–1.24)
Globulin: 2.1 g/dL (calc) (ref 1.9–3.7)
Glucose, Bld: 184 mg/dL — ABNORMAL HIGH (ref 65–99)
Potassium: 4.2 mmol/L (ref 3.5–5.3)
Sodium: 139 mmol/L (ref 135–146)
Total Bilirubin: 0.3 mg/dL (ref 0.2–1.2)
Total Protein: 6.6 g/dL (ref 6.1–8.1)
eGFR: 129 mL/min/{1.73_m2} (ref >=60)

## 2022-07-21 LAB — LIPID PANEL
Cholesterol: 182 mg/dL (ref <200)
HDL: 61 mg/dL (ref >=40)
LDL Cholesterol (Calc): 107 mg/dL (calc) — ABNORMAL HIGH
Non-HDL Cholesterol (Calc): 121 mg/dL (calc) (ref <130)
Total CHOL/HDL Ratio: 3 (calc) (ref <5.0)
Triglycerides: 62 mg/dL (ref <150)

## 2022-10-19 ENCOUNTER — Ambulatory Visit (INDEPENDENT_AMBULATORY_CARE_PROVIDER_SITE_OTHER): Payer: Commercial Managed Care - HMO | Admitting: Medical-Surgical

## 2022-10-19 VITALS — BP 136/76 | HR 124 | Resp 20 | Ht 68.0 in | Wt 124.0 lb

## 2022-10-19 DIAGNOSIS — I10 Essential (primary) hypertension: Secondary | ICD-10-CM

## 2022-10-19 DIAGNOSIS — E109 Type 1 diabetes mellitus without complications: Secondary | ICD-10-CM | POA: Diagnosis not present

## 2022-10-19 LAB — POCT GLYCOSYLATED HEMOGLOBIN (HGB A1C)
HbA1c, POC (controlled diabetic range): 10.6 % — AB (ref 0.0–7.0)
Hemoglobin A1C: 10.6 % — AB (ref 4.0–5.6)

## 2022-10-19 MED ORDER — DEXCOM G7 SENSOR MISC: 3 refills | Status: AC

## 2022-10-19 MED ORDER — INSULIN GLARGINE 100 UNIT/ML ~~LOC~~ SOLN: SUBCUTANEOUS | 5 refills | Status: AC

## 2022-10-19 NOTE — Progress Notes (Signed)
        Established patient visit  History, exam, impression, and plan:  1. Type 1 diabetes mellitus without complications [facility]) Very pleasant 27 year old male presenting today for follow-up on type 1 diabetes.  He has been working very hard over the last 3 months to get his A1c under control.  His previous A1c 11.4%.  Today reports that he has been using Lantus 20 mg every morning with 10 mg in the evening as instructed.  Uses mealtime insulin and practices carb counting for his meals.  Admits that he likes to eat and sometimes ends up using fast acting insulin up to 7 times in a 24-hour period.  Using the Dexcom 7 CGM to track his sugars.  Reports that during the day, his glucose rarely gets above 190 postprandially.  Most of his significant elevations are at night.  Previously having fasting sugars of 400 or more however since adding the 10 units at bedtime notes that his fasting sugars are now in the low to mid 200s.  Has not been able to get in with endocrinology yet but reports he has an appointment coming up with a "diabetic manager" but has not seen them yet.  Recheck of hemoglobin A1c today shows 10.6% which is an improvement however not quite at goal.  Continue mealtime insulin with carb counting and sliding scale as needed.  Continue Lantus 20 units in the morning.  Titrate bedtime Lantus up by 2 units every 3-4 days with a goal of fasting sugars less than 130.  Okay to titrate down by 2 units every 3 to 4 days if experiencing hypoglycemia in the mornings.  For now, max p.m. Lantus dose at 20 units however this may need to increase depending on the situation.  Strongly recommend getting in with endocrinology/diabetic management to discuss further options and recommendations for better glucose control.  Feel he would be an ideal candidate for an insulin pump. - POCT HgB A1C  2. Essential hypertension Taking lisinopril 10 mg daily, tolerating well without side effects.  Pressure is at goal today.   Denies concerning symptoms.  Normal cardiac and respiratory exam.  Attempted to collect urine microalbumin but was unable to void today.  Continue lisinopril as prescribed.   Procedures performed this visit: None.  Return in about 3 months (around 01/19/2023) for DM follow up.  __________________________________ Thayer Ohm, DNP, APRN, FNP-BC Primary Care and Sports Medicine [facility]

## 2023-01-19 ENCOUNTER — Ambulatory Visit: Payer: Commercial Managed Care - HMO | Admitting: Medical-Surgical

## 2023-08-10 ENCOUNTER — Other Ambulatory Visit: Payer: Self-pay

## 2023-08-10 DIAGNOSIS — E109 Type 1 diabetes mellitus without complications: Secondary | ICD-10-CM

## 2023-08-10 MED ORDER — INSULIN LISPRO 100 UNIT/ML IJ SOLN: INTRAMUSCULAR | 0 refills | Status: AC
